# Patient Record
Sex: Male | Born: 1960
Health system: Southern US, Community
[De-identification: ages and names within clinical notes are randomized; demographics above are authoritative.]

## PROBLEM LIST (undated history)

## (undated) DIAGNOSIS — K30 Functional dyspepsia: Secondary | ICD-10-CM

## (undated) DIAGNOSIS — Z9889 Other specified postprocedural states: Secondary | ICD-10-CM

## (undated) DIAGNOSIS — Z87442 Personal history of urinary calculi: Secondary | ICD-10-CM

## (undated) DIAGNOSIS — R269 Unspecified abnormalities of gait and mobility: Secondary | ICD-10-CM

## (undated) DIAGNOSIS — M1990 Unspecified osteoarthritis, unspecified site: Secondary | ICD-10-CM

## (undated) DIAGNOSIS — K5792 Diverticulitis of intestine, part unspecified, without perforation or abscess without bleeding: Secondary | ICD-10-CM

## (undated) DIAGNOSIS — J189 Pneumonia, unspecified organism: Secondary | ICD-10-CM

## (undated) DIAGNOSIS — E119 Type 2 diabetes mellitus without complications: Secondary | ICD-10-CM

## (undated) DIAGNOSIS — R112 Nausea with vomiting, unspecified: Secondary | ICD-10-CM

## (undated) DIAGNOSIS — Z973 Presence of spectacles and contact lenses: Secondary | ICD-10-CM

## (undated) HISTORY — DX: Diverticulitis of intestine, part unspecified, without perforation or abscess without bleeding: K57.92

## (undated) HISTORY — PX: MEDIAL COLLATERAL LIGAMENT REPAIR, KNEE: SHX2019

## (undated) HISTORY — PX: HERNIA REPAIR: SHX51

## (undated) HISTORY — PX: BILATERAL KNEE ARTHROSCOPY: SUR91

## (undated) HISTORY — PX: OTHER SURGICAL HISTORY: SHX169

## (undated) HISTORY — PX: ROTATOR CUFF REPAIR: SHX139

## (undated) HISTORY — PX: CERVICAL SPINE SURGERY: SHX589

---

## 2009-05-17 ENCOUNTER — Emergency Department (HOSPITAL_COMMUNITY): Admission: EM | Admit: 2009-05-17 | Discharge: 2009-05-17 | Payer: Self-pay | Admitting: Emergency Medicine

## 2009-11-01 ENCOUNTER — Emergency Department (HOSPITAL_COMMUNITY): Admission: EM | Admit: 2009-11-01 | Discharge: 2009-11-01 | Payer: Self-pay | Admitting: Emergency Medicine

## 2011-02-08 LAB — POCT CARDIAC MARKERS
Myoglobin, poc: 55.9 ng/mL (ref 12–200)
Myoglobin, poc: 60.4 ng/mL (ref 12–200)
Troponin i, poc: <0.05 ng/mL (ref 0.00–0.09)

## 2011-02-08 LAB — BASIC METABOLIC PANEL
Chloride: 105 mEq/L (ref 96–112)
Creatinine, Ser: 0.83 mg/dL (ref 0.4–1.5)
GFR calc Af Amer: >60 mL/min (ref >60)
Potassium: 3.8 mEq/L (ref 3.5–5.1)

## 2011-02-08 LAB — CBC
HCT: 43.2 % (ref 39.0–52.0)
MCV: 93.9 fL (ref 78.0–100.0)
RBC: 4.6 MIL/uL (ref 4.22–5.81)
WBC: 7.4 10*3/uL (ref 4.0–10.5)

## 2011-02-08 LAB — DIFFERENTIAL
Eosinophils Absolute: 0.1 10*3/uL (ref 0.0–0.7)
Lymphs Abs: 1.4 10*3/uL (ref 0.7–4.0)
Monocytes Relative: 6 % (ref 3–12)
Neutrophils Relative %: 73 % (ref 43–77)

## 2015-06-20 ENCOUNTER — Ambulatory Visit: Payer: Self-pay | Admitting: General Surgery

## 2015-06-20 NOTE — H&P (Unsigned)
History of Present Illness Axel Filler MD; 06/20/2015 10:47 AM) Patient words: Evaluate inguinal hernia.  The patient is a 54 year old male who presents with an inguinal hernia. Patient is a 54 year old male who is referred by Dr. Cleda Mccreedy for evaluation for a left inguinal hernia. The patient states he's had a proximal four-month history of left inguinal hernia. Patient is active and works on a farm. He notices some pain to his left testicle was treated for appears to be epididymitis. Patient underwent ultrasound which revealed a hernia. Patient is had no signs or symptoms of incarceration or granulation.   Other Problems Maryan Puls, CMA; 06/20/2015 10:32 AM) Arthritis Gastroesophageal Reflux Disease Inguinal Hernia Umbilical Hernia Repair  Past Surgical History Maryan Puls, CMA; 06/20/2015 10:32 AM) Colon Polyp Removal - Colonoscopy Foot Surgery Bilateral. Knee Surgery Bilateral. Open Inguinal Hernia Surgery Bilateral. Oral Surgery Shoulder Surgery Right. Vasectomy  Diagnostic Studies History Maryan Puls, [place]; 06/20/2015 10:32 AM) Colonoscopy 1-5 years ago  Allergies Maryan Puls, CMA; 06/20/2015 10:33 AM) No Known Drug Allergies08/18/2016  Medication History Maryan Puls, [place]; 06/20/2015 10:34 AM) Medications Reconciled  Social History Maryan Puls, CMA; 06/20/2015 10:32 AM) Alcohol use Occasional alcohol use. Caffeine use Carbonated beverages. No drug use Tobacco use Never smoker.  Family History Maryan Puls, [place]; 06/20/2015 10:32 AM) Arthritis Father, Mother. Breast Cancer Mother. Colon Cancer Mother. Heart Disease Father, Mother. Heart disease in male family member before age 88 Heart disease in male family member before age 28 Malignant Neoplasm Of Pancreas Mother. Melanoma Father, Mother. Respiratory Condition Father, Mother.  Review of Systems Maryan Puls CMA; 06/20/2015 10:32 AM) General Not Present-  Appetite Loss, Chills, Fatigue, Fever, Night Sweats, Weight Gain and Weight Loss. Skin Not Present- Change in Wart/Mole, Dryness, Hives, Jaundice, New Lesions, Non-Healing Wounds, Rash and Ulcer. HEENT Not Present- Earache, Hearing Loss, Hoarseness, Nose Bleed, Oral Ulcers, Ringing in the Ears, Seasonal Allergies, Sinus Pain, Sore Throat, Visual Disturbances, Wears glasses/contact lenses and Yellow Eyes. Respiratory Not Present- Bloody sputum, Chronic Cough, Difficulty Breathing, Snoring and Wheezing. Cardiovascular Not Present- Chest Pain, Difficulty Breathing Lying Down, Leg Cramps, Palpitations, Rapid Heart Rate, Shortness of Breath and Swelling of Extremities. Gastrointestinal Present- Abdominal Pain and Bloody Stool. Not Present- Bloating, Change in Bowel Habits, Chronic diarrhea, Constipation, Difficulty Swallowing, Excessive gas, Gets full quickly at meals, Hemorrhoids, Indigestion, Nausea, Rectal Pain and Vomiting. Male Genitourinary Not Present- Blood in Urine, Change in Urinary Stream, Frequency, Impotence, Nocturia, Painful Urination, Urgency and Urine Leakage. Musculoskeletal Present- Joint Pain and Joint Stiffness. Not Present- Back Pain, Muscle Pain, Muscle Weakness and Swelling of Extremities. Neurological Not Present- Decreased Memory, Fainting, Headaches, Numbness, Seizures, Tingling, Tremor, Trouble walking and Weakness. Psychiatric Not Present- Anxiety, Bipolar, Change in Sleep Pattern, Depression, Fearful and Frequent crying. Endocrine Not Present- Cold Intolerance, Excessive Hunger, Hair Changes, Heat Intolerance, Hot flashes and New Diabetes. Hematology Not Present- Easy Bruising, Excessive bleeding, Gland problems, HIV and Persistent Infections.   Vitals Maryan Puls CMA; 06/20/2015 10:32 AM) 06/20/2015 10:32 AM Weight: 301.2 lb Height: 73in Body Surface Area: 2.65 m Body Mass Index: 39.74 kg/m Temp.: 97.63F(Temporal)  Pulse: 72 (Regular)  Resp.: 18 (Unlabored)   BP: 140/86 (Sitting, Left Arm, Standard)    Physical Exam Axel Filler MD; 06/20/2015 10:48 AM) General Mental Status-Alert. General Appearance-Consistent with stated age. Hydration-Well hydrated. Voice-Normal.  Head and Neck Head-normocephalic, atraumatic with no lesions or palpable masses. Trachea-midline. Thyroid Gland Characteristics - normal size and consistency.  Chest and Lung Exam Chest and lung exam  reveals -quiet, even and easy respiratory effort with no use of accessory muscles and on auscultation, normal breath sounds, no adventitious sounds and normal vocal resonance. Inspection Chest Wall - Normal. Back - normal.  Cardiovascular Cardiovascular examination reveals -normal heart sounds, regular rate and rhythm with no murmurs and normal pedal pulses bilaterally.  Abdomen Inspection Skin - Scar - no surgical scars. Hernias - Inguinal hernia - Left - Reducible(small). Palpation/Percussion Normal exam - Soft, Non Tender, No Rebound tenderness, No Rigidity (guarding) and No hepatosplenomegaly. Auscultation Normal exam - Bowel sounds normal.    Assessment & Plan Axel Filler MD; 06/20/2015 10:49 AM) LEFT INGUINAL HERNIA (550.90  K40.90) Impression: 54 year old male with a small left inguinal hernia.  1. The patient will like to proceed to the operating room for laparoscopic left inguinal hernia repair.  2. I discussed with the patient the signs and symptoms of incarceration and strangulation and the need to proceed to the ER should they occur.  3. I discussed with the patient the risks and benefits of the procedure to include but not limited to: Infection, bleeding, damage to surrounding structures, possible need for further surgery, possible nerve pain, and possible recurrence. The patient was understanding and wishes to proceed.

## 2015-10-23 ENCOUNTER — Encounter (HOSPITAL_COMMUNITY): Payer: Self-pay

## 2015-10-23 ENCOUNTER — Encounter (HOSPITAL_COMMUNITY)
Admission: RE | Admit: 2015-10-23 | Discharge: 2015-10-23 | Disposition: A | Discharge status: Home / Self Care | Payer: BLUE CROSS/BLUE SHIELD | Source: Ambulatory Visit | Attending: Orthopedic Surgery | Admitting: Orthopedic Surgery

## 2015-10-23 DIAGNOSIS — Z0183 Encounter for blood typing: Secondary | ICD-10-CM | POA: Insufficient documentation

## 2015-10-23 DIAGNOSIS — M170 Bilateral primary osteoarthritis of knee: Secondary | ICD-10-CM | POA: Diagnosis not present

## 2015-10-23 DIAGNOSIS — Z01812 Encounter for preprocedural laboratory examination: Secondary | ICD-10-CM | POA: Insufficient documentation

## 2015-10-23 HISTORY — DX: Other specified postprocedural states: R11.2

## 2015-10-23 HISTORY — DX: Unspecified osteoarthritis, unspecified site: M19.90

## 2015-10-23 HISTORY — DX: Pneumonia, unspecified organism: J18.9

## 2015-10-23 HISTORY — DX: Unspecified abnormalities of gait and mobility: R26.9

## 2015-10-23 HISTORY — DX: Other specified postprocedural states: Z98.890

## 2015-10-23 HISTORY — DX: Personal history of urinary calculi: Z87.442

## 2015-10-23 HISTORY — DX: Functional dyspepsia: K30

## 2015-10-23 HISTORY — DX: Presence of spectacles and contact lenses: Z97.3

## 2015-10-23 LAB — CBC
HEMATOCRIT: 47.3 % (ref 39.0–52.0)
Hemoglobin: 16.8 g/dL (ref 13.0–17.0)
MCH: 33.2 pg (ref 26.0–34.0)
MCHC: 35.5 g/dL (ref 30.0–36.0)
MCV: 93.5 fL (ref 78.0–100.0)
Platelets: 180 10*3/uL (ref 150–400)
RBC: 5.06 MIL/uL (ref 4.22–5.81)
RDW: 14.4 % (ref 11.5–15.5)
WBC: 8.6 10*3/uL (ref 4.0–10.5)

## 2015-10-23 LAB — BASIC METABOLIC PANEL
Anion gap: 9 (ref 5–15)
BUN: 18 mg/dL (ref 6–20)
CHLORIDE: 105 mmol/L (ref 101–111)
CO2: 30 mmol/L (ref 22–32)
Calcium: 9.8 mg/dL (ref 8.9–10.3)
Creatinine, Ser: 0.97 mg/dL (ref 0.61–1.24)
GFR calc Af Amer: >60 mL/min (ref >60)
GFR calc non Af Amer: >60 mL/min (ref >60)
Glucose, Bld: 135 mg/dL — ABNORMAL HIGH (ref 65–99)
POTASSIUM: 4 mmol/L (ref 3.5–5.1)
SODIUM: 144 mmol/L (ref 135–145)

## 2015-10-23 LAB — APTT: aPTT: 27 seconds (ref 24–37)

## 2015-10-23 LAB — SURGICAL PCR SCREEN
MRSA, PCR: NEGATIVE
Staphylococcus aureus: NEGATIVE

## 2015-10-23 LAB — URINALYSIS, ROUTINE W REFLEX MICROSCOPIC
BILIRUBIN URINE: NEGATIVE
Glucose, UA: NEGATIVE mg/dL
HGB URINE DIPSTICK: NEGATIVE
Ketones, ur: NEGATIVE mg/dL
Leukocytes, UA: NEGATIVE
NITRITE: NEGATIVE
PROTEIN: NEGATIVE mg/dL
Specific Gravity, Urine: 1.02 (ref 1.005–1.030)
pH: 6 (ref 5.0–8.0)

## 2015-10-23 LAB — PROTIME-INR
INR: 1.06 (ref 0.00–1.49)
Prothrombin Time: 14 seconds (ref 11.6–15.2)

## 2015-10-23 LAB — ABO/RH: ABO/RH(D): O POS

## 2015-10-23 NOTE — Progress Notes (Signed)
Your patient has screened at an elevated risk for Obstructive Sleep Apnea using the Stop-Bang Tool during a pre-surgical visit. Pt scored at high risk.  

## 2015-10-23 NOTE — Patient Instructions (Signed)
Russell [name]  10/23/2015   Your procedure is scheduled on: Thursday October 31, 2015   Report to [facility]  Entrance take [place]  elevators to 3rd floor to  Short Stay Center at 5:30 AM.  Call this number if you have problems the morning of surgery [phone]   Remember: ONLY 1 PERSON MAY GO WITH YOU TO SHORT STAY TO GET  READY MORNING OF YOUR SURGERY.  Do not eat food or drink liquids :After Midnight.     Take these medicines the morning of surgery with A SIP OF WATER: NONE                                You may not have any metal on your body including hair pins and              piercings  Do not wear jewelry,  lotions, powders or colognes, deodorant                           Men may shave face and neck.   Do not bring valuables to the hospital. [facility] IS NOT             RESPONSIBLE   FOR VALUABLES.  Contacts, dentures or bridgework may not be worn into surgery.  Leave suitcase in the car. After surgery it may be brought to your room.              Please read over the following fact sheets you were given:MRSA INFORMATION SHEET; INCENTIVE SPIROMETER; BLOOD TRANSFUSION INFORMATION SHEET _____________________________________________________________________             [facility] - Preparing for Surgery Before surgery, you can play an important role.  Because skin is not sterile, your skin needs to be as free of germs as possible.  You can reduce the number of germs on your skin by washing with CHG (chlorahexidine gluconate) soap before surgery.  CHG is an antiseptic cleaner which kills germs and bonds with the skin to continue killing germs even after washing. Please DO NOT use if you have an allergy to CHG or antibacterial soaps.  If your skin becomes reddened/irritated stop using the CHG and inform your nurse when you arrive at Short Stay. Do not shave (including legs and underarms) for at least 48 hours prior to the first CHG shower.  You  may shave your face/neck. Please follow these instructions carefully:  1.  Shower with CHG Soap the night before surgery and the  morning of Surgery.  2.  If you choose to wash your hair, wash your hair first as usual with your  normal  shampoo.  3.  After you shampoo, rinse your hair and body thoroughly to remove the  shampoo.                           4.  Use CHG as you would any other liquid soap.  You can apply chg directly  to the skin and wash                       Gently with a scrungie or clean washcloth.  5.  Apply the CHG Soap to your body ONLY FROM THE  NECK DOWN.   Do not use on face/ open                           Wound or open sores. Avoid contact with eyes, ears mouth and genitals (private parts).                       Wash face,  Genitals (private parts) with your normal soap.             6.  Wash thoroughly, paying special attention to the area where your surgery  will be performed.  7.  Thoroughly rinse your body with warm water from the neck down.  8.  DO NOT shower/wash with your normal soap after using and rinsing off  the CHG Soap.                9.  Pat yourself dry with a clean towel.            10.  Wear clean pajamas.            11.  Place clean sheets on your bed the night of your first shower and do not  sleep with pets. Day of Surgery : Do not apply any lotions/deodorants the morning of surgery.  Please wear clean clothes to the hospital/surgery center.  FAILURE TO FOLLOW THESE INSTRUCTIONS MAY RESULT IN THE CANCELLATION OF YOUR SURGERY PATIENT SIGNATURE_________________________________  NURSE SIGNATURE__________________________________  ________________________________________________________________________   Russell MireIncentive Spirometer  An incentive spirometer is a tool that can help keep your lungs clear and active. This tool measures how well you are filling your lungs with each breath. Taking long deep breaths may help reverse or decrease the chance of  developing breathing (pulmonary) problems (especially infection) following:  A long period of time when you are unable to move or be active. BEFORE THE PROCEDURE   If the spirometer includes an indicator to show your best effort, your nurse or respiratory therapist will set it to a desired goal.  If possible, sit up straight or lean slightly forward. Try not to slouch.  Hold the incentive spirometer in an upright position. INSTRUCTIONS FOR USE   Sit on the edge of your bed if possible, or sit up as far as you can in bed or on a chair.  Hold the incentive spirometer in an upright position.  Breathe out normally.  Place the mouthpiece in your mouth and seal your lips tightly around it.  Breathe in slowly and as deeply as possible, raising the piston or the ball toward the top of the column.  Hold your breath for 3-5 seconds or for as long as possible. Allow the piston or ball to fall to the bottom of the column.  Remove the mouthpiece from your mouth and breathe out normally.  Rest for a few seconds and repeat Steps 1 through 7 at least 10 times every 1-2 hours when you are awake. Take your time and take a few normal breaths between deep breaths.  The spirometer may include an indicator to show your best effort. Use the indicator as a goal to work toward during each repetition.  After each set of 10 deep breaths, practice coughing to be sure your lungs are clear. If you have an incision (the cut made at the time of surgery), support your incision when coughing by placing a pillow or rolled up towels firmly against it. Once you are able  to get out of bed, walk around indoors and cough well. You may stop using the incentive spirometer when instructed by your caregiver.  RISKS AND COMPLICATIONS  Take your time so you do not get dizzy or light-headed.  If you are in pain, you may need to take or ask for pain medication before doing incentive spirometry. It is harder to take a deep  breath if you are having pain. AFTER USE  Rest and breathe slowly and easily.  It can be helpful to keep track of a log of your progress. Your caregiver can provide you with a simple table to help with this. If you are using the spirometer at home, follow these instructions: [place] IF:   You are having difficultly using the spirometer.  You have trouble using the spirometer as often as instructed.  Your pain medication is not giving enough relief while using the spirometer.  You develop fever of 100.5 F (38.1 C) or higher. SEEK IMMEDIATE MEDICAL CARE IF:   You cough up bloody sputum that had not been present before.  You develop fever of 102 F (38.9 C) or greater.  You develop worsening pain at or near the incision site. MAKE SURE YOU:   Understand these instructions.  Will watch your condition.  Will get help right away if you are not doing well or get worse. Document Released: 03/01/2007 Document Revised: 01/11/2012 Document Reviewed: 05/02/2007 Exit[employer] Patient Information 2014 Exit[employer], [place].   ________________________________________________________________________  WHAT IS A BLOOD TRANSFUSION? Blood Transfusion Information  A transfusion is the replacement of blood or some of its parts. Blood is made up of multiple cells which provide different functions.  Red blood cells carry oxygen and are used for blood loss replacement.  White blood cells fight against infection.  Platelets control bleeding.  Plasma helps clot blood.  Other blood products are available for specialized needs, such as hemophilia or other clotting disorders. BEFORE THE TRANSFUSION  Who gives blood for transfusions?   Healthy volunteers who are fully evaluated to make sure their blood is safe. This is blood bank blood. Transfusion therapy is the safest it has ever been in the practice of medicine. Before blood is taken from a donor, a complete history is taken to make sure  that person has no history of diseases nor engages in risky social behavior (examples are intravenous drug use or sexual activity with multiple partners). The donor's travel history is screened to minimize risk of transmitting infections, such as malaria. The donated blood is tested for signs of infectious diseases, such as HIV and hepatitis. The blood is then tested to be sure it is compatible with you in order to minimize the chance of a transfusion reaction. If you or a relative donates blood, this is often done in anticipation of surgery and is not appropriate for emergency situations. It takes many days to process the donated blood. RISKS AND COMPLICATIONS Although transfusion therapy is very safe and saves many lives, the main dangers of transfusion include:   Getting an infectious disease.  Developing a transfusion reaction. This is an allergic reaction to something in the blood you were given. Every precaution is taken to prevent this. The decision to have a blood transfusion has been considered carefully by your caregiver before blood is given. Blood is not given unless the benefits outweigh the risks. AFTER THE TRANSFUSION  Right after receiving a blood transfusion, you will usually feel much better and more energetic. This is especially true  if your red blood cells have gotten low (anemic). The transfusion raises the level of the red blood cells which carry oxygen, and this usually causes an energy increase.  The nurse administering the transfusion will monitor you carefully for complications. HOME CARE INSTRUCTIONS  No special instructions are needed after a transfusion. You may find your energy is better. Speak with your caregiver about any limitations on activity for underlying diseases you may have. SEEK MEDICAL CARE IF:   Your condition is not improving after your transfusion.  You develop redness or irritation at the intravenous (IV) site. SEEK IMMEDIATE MEDICAL CARE IF:  Any of  the following symptoms occur over the next 12 hours:  Shaking chills.  You have a temperature by mouth above 102 F (38.9 C), not controlled by medicine.  Chest, back, or muscle pain.  People around you feel you are not acting correctly or are confused.  Shortness of breath or difficulty breathing.  Dizziness and fainting.  You get a rash or develop hives.  You have a decrease in urine output.  Your urine turns a dark color or changes to pink, red, or brown. Any of the following symptoms occur over the next 10 days:  You have a temperature by mouth above 102 F (38.9 C), not controlled by medicine.  Shortness of breath.  Weakness after normal activity.  The white part of the eye turns yellow (jaundice).  You have a decrease in the amount of urine or are urinating less often.  Your urine turns a dark color or changes to pink, red, or brown. Document Released: 10/16/2000 Document Revised: 01/11/2012 Document Reviewed: 06/04/2008 [facility] Patient Information 2014 [place], [place].  _______________________________________________________________________

## 2015-10-28 NOTE — H&P (Signed)
UNICOMPARTMENTAL KNEE ADMISSION H&P  Patient is being admitted for bilateral medial unicompartmental knee arthroplasty.  Subjective:  Chief Complaint:  Bilateral knee medial compartmental primary OA /pain    HPI: Russell [name], 54 y.o. male male, has a history of pain and functional disability in the bilateral and has failed non-surgical conservative treatments for greater than 12 weeks to include NSAID's and/or analgesics, corticosteriod injections, viscosupplementation injections and activity modification.  Onset of symptoms was gradual, starting >10 years ago with gradually worsening course since that time. The patient noted prior procedures on the knee to include  ACL reconstruction on the left knee(s).  Patient currently rates pain in the bilateral knee(s) at 9 out of 10 with activity. Patient has worsening of pain with activity and weight bearing, pain that interferes with activities of daily living, pain with passive range of motion, crepitus and joint swelling.  Patient has evidence of periarticular osteophytes and joint space narrowing of the medial compartment by imaging studies.  There is no active infection.  Risks, benefits and expectations were discussed with the patient.  Risks including but not limited to the risk of anesthesia, blood clots, nerve damage, blood vessel damage, failure of the prosthesis, infection and up to and including death.  Patient understand the risks, benefits and expectations and wishes to proceed with surgery.   PCP: Donovan KailOSS,ALLeN, MD  D/C Plans:      Home with HHPT  Post-op Meds:       No Rx given   Tranexamic Acid:      To be given - IV    Decadron:    It is to be given  FYI:     Xarelto then ASA  Oxycodone  TUMS order needed     Past Medical History  Diagnosis Date  . PONV (postoperative nausea and vomiting)   . Wears glasses   . Abnormality of gait and mobility   . History of kidney stones   . Indigestion   . Arthritis   . Pneumonia      history of times one - 2010     Past Surgical History  Procedure Laterality Date  . Bilat achilles replacement     . Rotator cuff repair      right shoulder   . Hernia repair      inguinal - left   . Bilateral knee arthroscopy    . Medial collateral ligament repair, knee      bilat   . Acl bilat knee      repair   . Cervical spine surgery      C6 secondary to trauma     No Known Allergies  Social History  Substance Use Topics  . Smoking status: Never Smoker   . Smokeless tobacco: Never Used  . Alcohol Use: Yes     Comment: rarely     No family history on file.   Review of Systems  Constitutional: Negative.   HENT: Negative.   Eyes: Negative.   Respiratory: Negative.   Cardiovascular: Negative.   Gastrointestinal: Positive for heartburn.  Genitourinary: Negative.   Musculoskeletal: Positive for joint pain.  Skin: Negative.   Neurological: Negative.   Endo/Heme/Allergies: Negative.   Psychiatric/Behavioral: Negative.      Objective:   Physical Exam  Constitutional: He is well-developed, well-nourished, and in no distress.  HENT:  Head: Normocephalic.  Eyes: Pupils are equal, round, and reactive to light.  Neck: Neck supple. No JVD present. No tracheal deviation present. No thyromegaly present.  Cardiovascular: Normal rate, regular rhythm, normal heart sounds and intact distal pulses.   Pulmonary/Chest: Effort normal and breath sounds normal. No stridor. No respiratory distress. He has no wheezes.  Abdominal: Soft. There is no tenderness. There is no guarding.  Musculoskeletal:       Right knee: He exhibits decreased range of motion, swelling and bony tenderness. He exhibits no ecchymosis, no deformity, no laceration and no erythema. Tenderness found. Medial joint line tenderness noted. No lateral joint line tenderness noted.       Left knee: He exhibits decreased range of motion, swelling and bony tenderness. He exhibits no ecchymosis, no deformity,  no laceration and no erythema. Tenderness found. Medial joint line tenderness noted. No lateral joint line tenderness noted.  Lymphadenopathy:    He has no cervical adenopathy.  Neurological: He is alert. A sensory deficit (Numbness in bilateral feet) is present.  Skin: Skin is warm and dry.  Psychiatric: Affect normal.         Imaging Review Plain radiographs demonstrate severe degenerative joint disease of the bilateral knee(s) medial compartment.  The bone quality appears to be good for age and reported activity level.  Assessment/Plan:  End stage arthritis, bilateral knee medial compartment  The patient history, physical examination, clinical judgment of the provider and imaging studies are consistent with end stage degenerative joint disease of the bilateral knee(s) and medial unicompartmental knee arthroplasty is deemed medically necessary. The treatment options including medical management, injection therapy arthroscopy and arthroplasty were discussed at length. The risks and benefits of total knee arthroplasty were presented and reviewed. The risks due to aseptic loosening, infection, stiffness, patella tracking problems, thromboembolic complications and other imponderables were discussed. The patient acknowledged the explanation, agreed to proceed with the plan and consent was signed. Patient is being admitted for outpatient / observation treatment for surgery, pain control, PT, OT, prophylactic antibiotics, VTE prophylaxis, progressive ambulation and ADL's and discharge planning. The patient is planning to be discharged home with home health services.    Anastasio Auerbach Tarren Sabree   PA-C  10/28/2015, 10:08 PM

## 2015-10-30 MED ORDER — DEXTROSE 5 % IV SOLN
3.0000 g | INTRAVENOUS | Status: AC
  Administered 2015-10-31: 3 g via INTRAVENOUS
  Filled 2015-10-30 (×2): qty 3000

## 2015-10-30 NOTE — Anesthesia Preprocedure Evaluation (Addendum)
Anesthesia Evaluation  Patient identified by MRN, date of birth, ID band Patient awake    Reviewed: Allergy & Precautions, NPO status , Patient's Chart, lab work & pertinent test results  History of Anesthesia Complications (+) PONV and history of anesthetic complications  Airway Mallampati: II  TM Distance: >3 FB Neck ROM: Full    Dental  (+) Teeth Intact, Dental Advisory Given   Pulmonary neg pulmonary ROS,    Pulmonary exam normal        Cardiovascular negative cardio ROS Normal cardiovascular exam     Neuro/Psych negative neurological ROS     GI/Hepatic negative GI ROS,   Endo/Other  Morbid obesity  Renal/GU      Musculoskeletal   Abdominal   Peds  Hematology   Anesthesia Other Findings   Reproductive/Obstetrics                            Anesthesia Physical Anesthesia Plan  ASA: III  Anesthesia Plan: MAC and Spinal   Post-op Pain Management:    Induction:   Airway Management Planned: Simple Face Mask  Additional Equipment:   Intra-op Plan:   Post-operative Plan:   Informed Consent: I have reviewed the patients History and Physical, chart, labs and discussed the procedure including the risks, benefits and alternatives for the proposed anesthesia with the patient or authorized representative who has indicated his/her understanding and acceptance.   Dental advisory given  Plan Discussed with: CRNA and Anesthesiologist  Anesthesia Plan Comments:        Anesthesia Quick Evaluation

## 2015-10-31 ENCOUNTER — Encounter (HOSPITAL_COMMUNITY): Payer: Self-pay | Admitting: *Deleted

## 2015-10-31 ENCOUNTER — Observation Stay (HOSPITAL_COMMUNITY)
Admission: RE | Admit: 2015-10-31 | Discharge: 2015-11-01 | Disposition: A | Discharge status: Home / Self Care | Payer: BLUE CROSS/BLUE SHIELD | Source: Ambulatory Visit | Attending: Orthopedic Surgery | Admitting: Orthopedic Surgery

## 2015-10-31 ENCOUNTER — Inpatient Hospital Stay (HOSPITAL_COMMUNITY): Payer: BLUE CROSS/BLUE SHIELD | Admitting: Anesthesiology

## 2015-10-31 ENCOUNTER — Encounter (HOSPITAL_COMMUNITY)
Admission: RE | Disposition: A | Discharge status: Home / Self Care | Payer: Self-pay | Source: Ambulatory Visit | Attending: Orthopedic Surgery

## 2015-10-31 DIAGNOSIS — M170 Bilateral primary osteoarthritis of knee: Secondary | ICD-10-CM | POA: Diagnosis not present

## 2015-10-31 DIAGNOSIS — Z96653 Presence of artificial knee joint, bilateral: Secondary | ICD-10-CM

## 2015-10-31 DIAGNOSIS — Z87442 Personal history of urinary calculi: Secondary | ICD-10-CM | POA: Diagnosis not present

## 2015-10-31 DIAGNOSIS — Z6841 Body Mass Index (BMI) 40.0 and over, adult: Secondary | ICD-10-CM | POA: Insufficient documentation

## 2015-10-31 DIAGNOSIS — R269 Unspecified abnormalities of gait and mobility: Secondary | ICD-10-CM | POA: Diagnosis not present

## 2015-10-31 HISTORY — PX: PARTIAL KNEE ARTHROPLASTY: SHX2174

## 2015-10-31 HISTORY — DX: Presence of artificial knee joint, bilateral: Z96.653

## 2015-10-31 LAB — TYPE AND SCREEN
ABO/RH(D): O POS
ANTIBODY SCREEN: NEGATIVE

## 2015-10-31 SURGERY — ARTHROPLASTY, KNEE, BILATERAL, UNICOMPARTMENTAL
Anesthesia: Monitor Anesthesia Care | Site: Knee | Laterality: Bilateral

## 2015-10-31 MED ORDER — HYDROMORPHONE HCL 1 MG/ML IJ SOLN: 0.2500 mg | INTRAMUSCULAR | Status: DC | PRN

## 2015-10-31 MED ORDER — MENTHOL 3 MG MT LOZG: 1.0000 | LOZENGE | OROMUCOSAL | Status: DC | PRN

## 2015-10-31 MED ORDER — DIPHENHYDRAMINE HCL 25 MG PO CAPS: 25.0000 mg | ORAL_CAPSULE | Freq: Four times a day (QID) | ORAL | Status: DC | PRN

## 2015-10-31 MED ORDER — SODIUM CHLORIDE 0.9 % IV SOLN
INTRAVENOUS | Status: DC
  Administered 2015-10-31: 13:00:00 via INTRAVENOUS
  Filled 2015-10-31 (×4): qty 1000

## 2015-10-31 MED ORDER — CELECOXIB 200 MG PO CAPS
200.0000 mg | ORAL_CAPSULE | Freq: Two times a day (BID) | ORAL | Status: DC
  Administered 2015-10-31 – 2015-11-01 (×2): 200 mg via ORAL
  Filled 2015-10-31 (×4): qty 1

## 2015-10-31 MED ORDER — MIDAZOLAM HCL 2 MG/2ML IJ SOLN
INTRAMUSCULAR | Status: AC
  Filled 2015-10-31: qty 2

## 2015-10-31 MED ORDER — PROMETHAZINE HCL 25 MG/ML IJ SOLN: 6.2500 mg | INTRAMUSCULAR | Status: DC | PRN

## 2015-10-31 MED ORDER — SODIUM CHLORIDE 0.9 % IJ SOLN
INTRAMUSCULAR | Status: AC
  Filled 2015-10-31: qty 50

## 2015-10-31 MED ORDER — DEXAMETHASONE SODIUM PHOSPHATE 10 MG/ML IJ SOLN
10.0000 mg | Freq: Once | INTRAMUSCULAR | Status: AC
  Administered 2015-11-01: 10 mg via INTRAVENOUS
  Filled 2015-10-31: qty 1

## 2015-10-31 MED ORDER — RIVAROXABAN 10 MG PO TABS
10.0000 mg | ORAL_TABLET | Freq: Every day | ORAL | Status: DC
  Administered 2015-11-01: 10 mg via ORAL
  Filled 2015-10-31 (×2): qty 1

## 2015-10-31 MED ORDER — PROPOFOL 10 MG/ML IV BOLUS
INTRAVENOUS | Status: AC
  Filled 2015-10-31: qty 20

## 2015-10-31 MED ORDER — LACTATED RINGERS IV SOLN
INTRAVENOUS | Status: DC | PRN
  Administered 2015-10-31 (×3): via INTRAVENOUS

## 2015-10-31 MED ORDER — PROPOFOL 10 MG/ML IV BOLUS
INTRAVENOUS | Status: DC | PRN
  Administered 2015-10-31 (×2): 20 mg via INTRAVENOUS
  Administered 2015-10-31: 10 mg via INTRAVENOUS
  Administered 2015-10-31 (×3): 20 mg via INTRAVENOUS

## 2015-10-31 MED ORDER — METOCLOPRAMIDE HCL 10 MG PO TABS: 5.0000 mg | ORAL_TABLET | Freq: Three times a day (TID) | ORAL | Status: DC | PRN

## 2015-10-31 MED ORDER — BUPIVACAINE-EPINEPHRINE 0.25% -1:200000 IJ SOLN
INTRAMUSCULAR | Status: DC | PRN
  Administered 2015-10-31 (×2): 15 mL

## 2015-10-31 MED ORDER — METOCLOPRAMIDE HCL 5 MG/ML IJ SOLN: 5.0000 mg | Freq: Three times a day (TID) | INTRAMUSCULAR | Status: DC | PRN

## 2015-10-31 MED ORDER — ONDANSETRON HCL 4 MG/2ML IJ SOLN
INTRAMUSCULAR | Status: DC | PRN
  Administered 2015-10-31: 4 mg via INTRAVENOUS

## 2015-10-31 MED ORDER — CHLORHEXIDINE GLUCONATE 4 % EX LIQD: 60.0000 mL | Freq: Once | CUTANEOUS | Status: DC

## 2015-10-31 MED ORDER — DEXAMETHASONE SODIUM PHOSPHATE 10 MG/ML IJ SOLN: 10.0000 mg | Freq: Once | INTRAMUSCULAR | Status: DC

## 2015-10-31 MED ORDER — OXYCODONE HCL 5 MG PO TABS
5.0000 mg | ORAL_TABLET | ORAL | Status: DC | PRN
  Administered 2015-10-31 – 2015-11-01 (×7): 10 mg via ORAL
  Filled 2015-10-31 (×7): qty 2

## 2015-10-31 MED ORDER — FENTANYL CITRATE (PF) 100 MCG/2ML IJ SOLN
INTRAMUSCULAR | Status: DC | PRN
  Administered 2015-10-31 (×4): 50 ug via INTRAVENOUS

## 2015-10-31 MED ORDER — CEFAZOLIN SODIUM-DEXTROSE 2-3 GM-% IV SOLR
2.0000 g | Freq: Four times a day (QID) | INTRAVENOUS | Status: AC
  Administered 2015-10-31 (×2): 2 g via INTRAVENOUS
  Filled 2015-10-31 (×2): qty 50

## 2015-10-31 MED ORDER — FENTANYL CITRATE (PF) 100 MCG/2ML IJ SOLN
INTRAMUSCULAR | Status: AC
  Filled 2015-10-31: qty 2

## 2015-10-31 MED ORDER — KETOROLAC TROMETHAMINE 30 MG/ML IJ SOLN
INTRAMUSCULAR | Status: AC
  Filled 2015-10-31: qty 1

## 2015-10-31 MED ORDER — ALUM & MAG HYDROXIDE-SIMETH 200-200-20 MG/5ML PO SUSP: 30.0000 mL | ORAL | Status: DC | PRN

## 2015-10-31 MED ORDER — MIDAZOLAM HCL 5 MG/5ML IJ SOLN
INTRAMUSCULAR | Status: DC | PRN
  Administered 2015-10-31: 2 mg via INTRAVENOUS

## 2015-10-31 MED ORDER — PROPOFOL 10 MG/ML IV BOLUS
INTRAVENOUS | Status: AC
Start: 2015-10-31 — End: 2015-10-31
  Filled 2015-10-31: qty 60

## 2015-10-31 MED ORDER — MAGNESIUM CITRATE PO SOLN: 1.0000 | Freq: Once | ORAL | Status: DC | PRN

## 2015-10-31 MED ORDER — ONDANSETRON HCL 4 MG PO TABS: 4.0000 mg | ORAL_TABLET | Freq: Four times a day (QID) | ORAL | Status: DC | PRN

## 2015-10-31 MED ORDER — ALPRAZOLAM 0.25 MG PO TABS
0.2500 mg | ORAL_TABLET | Freq: Three times a day (TID) | ORAL | Status: DC | PRN
  Administered 2015-10-31: 0.25 mg via ORAL
  Filled 2015-10-31: qty 1

## 2015-10-31 MED ORDER — SODIUM CHLORIDE 0.9 % IV SOLN
1000.0000 mg | Freq: Once | INTRAVENOUS | Status: AC
  Administered 2015-10-31: 1000 mg via INTRAVENOUS
  Filled 2015-10-31: qty 10

## 2015-10-31 MED ORDER — EPHEDRINE SULFATE 50 MG/ML IJ SOLN
INTRAMUSCULAR | Status: AC
  Filled 2015-10-31: qty 1

## 2015-10-31 MED ORDER — PROPOFOL 10 MG/ML IV BOLUS
INTRAVENOUS | Status: AC
  Filled 2015-10-31: qty 40

## 2015-10-31 MED ORDER — SCOPOLAMINE 1 MG/3DAYS TD PT72
MEDICATED_PATCH | TRANSDERMAL | Status: DC | PRN
  Administered 2015-10-31: 1 via TRANSDERMAL

## 2015-10-31 MED ORDER — METHOCARBAMOL 1000 MG/10ML IJ SOLN
500.0000 mg | Freq: Four times a day (QID) | INTRAVENOUS | Status: DC | PRN
  Administered 2015-10-31: 500 mg via INTRAVENOUS
  Filled 2015-10-31 (×2): qty 5

## 2015-10-31 MED ORDER — ONDANSETRON HCL 4 MG/2ML IJ SOLN: 4.0000 mg | Freq: Four times a day (QID) | INTRAMUSCULAR | Status: DC | PRN

## 2015-10-31 MED ORDER — 0.9 % SODIUM CHLORIDE (POUR BTL) OPTIME
TOPICAL | Status: DC | PRN
  Administered 2015-10-31 (×2): 1000 mL

## 2015-10-31 MED ORDER — BUPIVACAINE HCL (PF) 0.5 % IJ SOLN
INTRAMUSCULAR | Status: DC | PRN
  Administered 2015-10-31: 3 mL via INTRATHECAL

## 2015-10-31 MED ORDER — CALCIUM CARBONATE ANTACID 500 MG PO CHEW: 1.0000 | CHEWABLE_TABLET | Freq: Two times a day (BID) | ORAL | Status: DC | PRN

## 2015-10-31 MED ORDER — HYDROMORPHONE HCL 1 MG/ML IJ SOLN
0.5000 mg | INTRAMUSCULAR | Status: DC | PRN
  Administered 2015-10-31: 1 mg via INTRAVENOUS
  Administered 2015-10-31: 0.5 mg via INTRAVENOUS
  Filled 2015-10-31 (×2): qty 1

## 2015-10-31 MED ORDER — POLYETHYLENE GLYCOL 3350 17 G PO PACK: 17.0000 g | PACK | Freq: Two times a day (BID) | ORAL | Status: DC

## 2015-10-31 MED ORDER — SODIUM CHLORIDE 0.9 % IJ SOLN
INTRAMUSCULAR | Status: AC
  Filled 2015-10-31: qty 10

## 2015-10-31 MED ORDER — ONDANSETRON HCL 4 MG/2ML IJ SOLN
INTRAMUSCULAR | Status: AC
  Filled 2015-10-31: qty 2

## 2015-10-31 MED ORDER — PHENOL 1.4 % MT LIQD: 1.0000 | OROMUCOSAL | Status: DC | PRN

## 2015-10-31 MED ORDER — KETOROLAC TROMETHAMINE 30 MG/ML IJ SOLN
INTRAMUSCULAR | Status: DC | PRN
  Administered 2015-10-31 (×2): 15 mg

## 2015-10-31 MED ORDER — SODIUM CHLORIDE 0.9 % IJ SOLN
INTRAMUSCULAR | Status: DC | PRN
  Administered 2015-10-31 (×2): 15 mL

## 2015-10-31 MED ORDER — BISACODYL 10 MG RE SUPP: 10.0000 mg | Freq: Every day | RECTAL | Status: DC | PRN

## 2015-10-31 MED ORDER — PROPOFOL 500 MG/50ML IV EMUL
INTRAVENOUS | Status: DC | PRN
  Administered 2015-10-31: 50 ug/kg/min via INTRAVENOUS

## 2015-10-31 MED ORDER — FERROUS SULFATE 325 (65 FE) MG PO TABS
325.0000 mg | ORAL_TABLET | Freq: Three times a day (TID) | ORAL | Status: DC
  Administered 2015-11-01: 325 mg via ORAL
  Filled 2015-10-31 (×4): qty 1

## 2015-10-31 MED ORDER — BUPIVACAINE-EPINEPHRINE (PF) 0.25% -1:200000 IJ SOLN
INTRAMUSCULAR | Status: AC
  Filled 2015-10-31: qty 30

## 2015-10-31 MED ORDER — SCOPOLAMINE 1 MG/3DAYS TD PT72
MEDICATED_PATCH | TRANSDERMAL | Status: AC
  Filled 2015-10-31: qty 1

## 2015-10-31 MED ORDER — DOCUSATE SODIUM 100 MG PO CAPS
100.0000 mg | ORAL_CAPSULE | Freq: Two times a day (BID) | ORAL | Status: DC
  Administered 2015-10-31 – 2015-11-01 (×2): 100 mg via ORAL

## 2015-10-31 MED ORDER — METHOCARBAMOL 500 MG PO TABS
500.0000 mg | ORAL_TABLET | Freq: Four times a day (QID) | ORAL | Status: DC | PRN
  Administered 2015-10-31 – 2015-11-01 (×2): 500 mg via ORAL
  Filled 2015-10-31 (×2): qty 1

## 2015-10-31 MED ORDER — DEXAMETHASONE SODIUM PHOSPHATE 10 MG/ML IJ SOLN
INTRAMUSCULAR | Status: AC
  Filled 2015-10-31: qty 1

## 2015-10-31 SURGICAL SUPPLY — 59 items
BAG DECANTER FOR FLEXI CONT (MISCELLANEOUS) IMPLANT
BAG SPEC THK2 15X12 ZIP CLS (MISCELLANEOUS) ×1
BAG ZIPLOCK 12X15 (MISCELLANEOUS) ×2 IMPLANT
BANDAGE ACE 6X5 VEL STRL LF (GAUZE/BANDAGES/DRESSINGS) ×4 IMPLANT
BANDAGE ELASTIC 6 VELCRO ST LF (GAUZE/BANDAGES/DRESSINGS) ×4 IMPLANT
BANDAGE ESMARK 6X9 LF (GAUZE/BANDAGES/DRESSINGS) ×1 IMPLANT
BLADE SAW RECIPROCATING 77.5 (BLADE) ×4 IMPLANT
BLADE SAW SGTL 18X1.27X75 (BLADE) ×4 IMPLANT
BNDG COHESIVE 6X5 TAN STRL LF (GAUZE/BANDAGES/DRESSINGS) ×2 IMPLANT
BNDG ESMARK 6X9 LF (GAUZE/BANDAGES/DRESSINGS) ×2
BOWL SMART MIX CTS (DISPOSABLE) ×4 IMPLANT
CAPT KNEE PARTIAL 2 ×4 IMPLANT
CEMENT BONE DEPUY (Cement) ×4 IMPLANT
CLEANER TIP ELECTROSURG 2X2 (MISCELLANEOUS) ×2 IMPLANT
CUFF TOURN SGL QUICK 34 (TOURNIQUET CUFF) ×2
CUFF TRNQT CYL 34X4X40X1 (TOURNIQUET CUFF) ×2 IMPLANT
DRAPE EXTREMITY BILATERAL (DRAPE) ×2 IMPLANT
DRAPE INCISE IOBAN 66X45 STRL (DRAPES) ×2 IMPLANT
DRAPE POUCH INSTRU U-SHP 10X18 (DRAPES) ×2 IMPLANT
DRAPE SHEET LG 3/4 BI-LAMINATE (DRAPES) IMPLANT
DRAPE U-SHAPE 47X51 STRL (DRAPES) ×6 IMPLANT
DRSG AQUACEL AG ADV 3.5X10 (GAUZE/BANDAGES/DRESSINGS) ×6 IMPLANT
DURAPREP 26ML APPLICATOR (WOUND CARE) ×4 IMPLANT
ELECT CAUTERY BLADE 6.4 (BLADE) ×2 IMPLANT
ELECT PENCIL ROCKER SW 15FT (MISCELLANEOUS) ×2 IMPLANT
ELECT REM PT RETURN 9FT ADLT (ELECTROSURGICAL) ×2
ELECTRODE REM PT RTRN 9FT ADLT (ELECTROSURGICAL) ×1 IMPLANT
FACESHIELD WRAPAROUND (MASK) IMPLANT
GLOVE BIOGEL M 7.0 STRL (GLOVE) IMPLANT
GLOVE BIOGEL PI IND STRL 7.5 (GLOVE) ×4 IMPLANT
GLOVE BIOGEL PI IND STRL 8.5 (GLOVE) ×1 IMPLANT
GLOVE BIOGEL PI INDICATOR 7.5 (GLOVE) ×4
GLOVE BIOGEL PI INDICATOR 8.5 (GLOVE) ×1
GLOVE ECLIPSE 8.0 STRL XLNG CF (GLOVE) ×4 IMPLANT
GLOVE ORTHO TXT STRL SZ7.5 (GLOVE) ×6 IMPLANT
GLOVE SURG SS PI 7.5 STRL IVOR (GLOVE) ×6 IMPLANT
GOWN BRE IMP SLV SIRUS LXLNG (GOWN DISPOSABLE) ×2 IMPLANT
GOWN STRL REUS W/TWL 2XL LVL3 (GOWN DISPOSABLE) IMPLANT
GOWN STRL REUS W/TWL LRG LVL3 (GOWN DISPOSABLE) ×2 IMPLANT
GOWN STRL REUS W/TWL XL LVL3 (GOWN DISPOSABLE) ×4 IMPLANT
KIT BASIN OR (CUSTOM PROCEDURE TRAY) IMPLANT
LEGGING LITHOTOMY PAIR STRL (DRAPES) ×2 IMPLANT
LIQUID BAND (GAUZE/BANDAGES/DRESSINGS) ×4 IMPLANT
MANIFOLD NEPTUNE II (INSTRUMENTS) ×2 IMPLANT
MARKER SKIN DUAL TIP RULER LAB (MISCELLANEOUS) IMPLANT
PACK TOTAL JOINT (CUSTOM PROCEDURE TRAY) IMPLANT
PACK TOTAL KNEE CUSTOM (KITS) ×2 IMPLANT
POSITIONER SURGICAL ARM (MISCELLANEOUS) IMPLANT
SPONGE LAP 18X18 X RAY DECT (DISPOSABLE) ×2 IMPLANT
STOCKINETTE 8 INCH (MISCELLANEOUS) ×2 IMPLANT
SUCTION FRAZIER TIP 10 FR DISP (SUCTIONS) ×2 IMPLANT
SUT MNCRL AB 4-0 PS2 18 (SUTURE) ×4 IMPLANT
SUT VIC AB 1 CT1 36 (SUTURE) ×4 IMPLANT
SUT VIC AB 2-0 CT1 27 (SUTURE) ×5
SUT VIC AB 2-0 CT1 TAPERPNT 27 (SUTURE) ×5 IMPLANT
TOWEL OR 17X26 10 PK STRL BLUE (TOWEL DISPOSABLE) ×2 IMPLANT
TRAY FOLEY W/METER SILVER 14FR (SET/KITS/TRAYS/PACK) IMPLANT
TRAY FOLEY W/METER SILVER 16FR (SET/KITS/TRAYS/PACK) ×2 IMPLANT
YANKAUER SUCT BULB TIP 10FT TU (MISCELLANEOUS) IMPLANT

## 2015-10-31 NOTE — Evaluation (Signed)
Physical Therapy Evaluation Patient Details Name: Russell [name] MRN: [id number] DOB: [birth year] Today's Date: 10/31/2015   History of Present Illness  Pt is a 54 year old male s/p bilateral unicompartmental knee medially  Clinical Impression  Patient is s/p above surgery resulting in functional limitations due to the deficits listed below (see PT Problem List).  Patient will benefit from skilled PT to increase their independence and safety with mobility to allow discharge to the venue listed below.  Pt tolerated mobility well POD #0 and anticipates d/c home with spouse tomorrow.      Follow Up Recommendations  (per MD)    Equipment Recommendations  Rolling walker with 5" wheels    Recommendations for Other Services       Precautions / Restrictions Precautions Precautions: Fall;Knee Restrictions Other Position/Activity Restrictions: WBAT bilaterally      Mobility  Bed Mobility Overal bed mobility: Needs Assistance Bed Mobility: Supine to Sit     Supine to sit: Min assist;HOB elevated     General bed mobility comments: slight assist for trunk, increased time  Transfers Overall transfer level: Needs assistance Equipment used: Rolling walker (2 wheeled) Transfers: Sit to/from Stand Sit to Stand: Min guard         General transfer comment: verbal cues for safe technique, including UE and LE placement  Ambulation/Gait Ambulation/Gait assistance: Min guard Ambulation Distance (Feet): 35 Feet Assistive device: Rolling walker (2 wheeled) Gait Pattern/deviations: Step-through pattern;Decreased stride length;Shuffle;Wide base of support     General Gait Details: verbal cues for RW positioning, small short steps, no increase in pain  Stairs            Wheelchair Mobility    Modified Rankin (Stroke Patients Only)       Balance                                             Pertinent Vitals/Pain Pain Assessment: 0-10 Pain Score: 2   Pain Location: bil knees Pain Descriptors / Indicators: Sore Pain Intervention(s): Limited activity within patient's tolerance;Monitored during session;Premedicated before session;Repositioned    Home Living Family/patient expects to be discharged to:: Private residence Living Arrangements: Spouse/significant other     Home Access: Level entry     Home Layout: One level Home Equipment: None      Prior Function Level of Independence: Independent               Hand Dominance        Extremity/Trunk Assessment               Lower Extremity Assessment: RLE deficits/detail;LLE deficits/detail RLE Deficits / Details: observed at least 50* functionally bilaterally sitting EOB, able to perform bil SLR       Communication   Communication: No difficulties  Cognition Arousal/Alertness: Awake/alert Behavior During Therapy: WFL for tasks assessed/performed Overall Cognitive Status: Within Functional Limits for tasks assessed                      General Comments      Exercises        Assessment/Plan    PT Assessment Patient needs continued PT services  PT Diagnosis Difficulty walking;Acute pain   PT Problem List Decreased strength;Decreased range of motion;Decreased balance;Decreased mobility;Decreased knowledge of use of DME;Pain  PT Treatment Interventions Functional mobility training;Gait training;DME instruction;Patient/family education;Therapeutic activities;Therapeutic exercise  PT Goals (Current goals can be found in the Care Plan section) Acute Rehab PT Goals Patient Stated Goal: home as soon as possible PT Goal Formulation: With patient Time For Goal Achievement: 11/04/15 Potential to Achieve Goals: Good    Frequency 7X/week   Barriers to discharge        Co-evaluation               End of Session Equipment Utilized During Treatment: Gait belt Activity Tolerance: Patient tolerated treatment well Patient left: in chair;with  call bell/phone within reach;with family/visitor present Nurse Communication: Mobility status    Functional Assessment Tool Used: clinical judgement Functional Limitation: Mobility: Walking and moving around Mobility: Walking and Moving Around Current Status [phone](G8978): At least 20 percent but less than 40 percent impaired, limited or restricted Mobility: Walking and Moving Around Goal Status [phone](G8979): At least 1 percent but less than 20 percent impaired, limited or restricted    Time: [id number] PT Time Calculation (min) (ACUTE ONLY): 21 min   Charges:   PT Evaluation $Initial PT Evaluation Tier I: 1 Procedure     PT G Codes:   PT G-Codes **NOT FOR INPATIENT CLASS** Functional Assessment Tool Used: clinical judgement Functional Limitation: Mobility: Walking and moving around Mobility: Walking and Moving Around Current Status ([id number](G8978): At least 20 percent but less than 40 percent impaired, limited or restricted Mobility: Walking and Moving Around Goal Status [phone](G8979): At least 1 percent but less than 20 percent impaired, limited or restricted    Esa Raden,KATHrine E 10/31/2015, 4:19 PM Russell [name], PT, DPT 10/31/2015 Pager: [phone]

## 2015-10-31 NOTE — Op Note (Signed)
NAME: Russell [name]    MEDICAL RECORD NO.: [id number]   FACILITY: [facility]   DATE OF BIRTH: Aug 24, 1961  PHYSICIAN: Madlyn Frankel. Charlann Boxer, M.D.    DATE OF PROCEDURE: 10/31/2015    OPERATIVE REPORT   PREOPERATIVE DIAGNOSIS: Left knee medial compartment osteoarthritis.   POSTOPERATIVE DIAGNOSIS: Left knee medial compartment osteoarthritis.  PROCEDURE: Left partial knee replacement utilizing Biomet Oxford knee  component, size Medium femur, a left medial size C tibial tray with a size 5 mm insert.   SURGEON: Madlyn Frankel. Charlann Boxer, M.D.   ASSISTANT: Lanney Gins, PAC.  Please note that Mr. Russell [name] was present for the entirety of the case,  utilized for preoperative positioning, perioperative retractor  management, general facilitation of the case and primary wound closure.   ANESTHESIA: General.   SPECIMENS: None.   COMPLICATIONS: None.  DRAINS: None   TOURNIQUET TIME: 40 minutes at 250 mmHg.   INDICATIONS FOR PROCEDURE: The patient is a 54 y.o. patient of mine who presented for evaluation of bilateral knee pain.  They presented with primary complaints of pain on the medial side of their knee. Radiographs revealed advanced medial compartment arthritis with specifically an antero-medial wear pattern.  There was bone on bone changes noted with subchondral sclerosis and osteophytes present. The patient has had progressive problems failing to respond to conservative measures of medications, injections and activity modification. Risks of infection, DVT, component failure, need for future revision surgery were all discussed and reviewed.  Consent was obtained for benefit of pain relief.   PROCEDURE IN DETAIL: The patient was brought to the operative theater.  Once adequate anesthesia, preoperative antibiotics, 3 grams of Ancef, 1 gm of Tranexamic Acid, and 10 mg of Decadron administered, the patient was positioned in supine position with a left thigh tourniquet  placed. The left lower extremity was  prepped and draped in sterile  fashion with the leg on the Oxford leg holder.  The leg was allowed to flex to 120 degrees. A time-out  was performed identifying the patient, planned procedure, and extremity.  The leg was exsanguinated, tourniquet elevated to 250 mmHg. A midline  incision was made from the proximal pole of the patella to the tibial tubercle. A  soft tissue plane was created and partial median arthrotomy was then  made to allow for subluxation of the patella. Following initial synovectomy and  debridement, the osteophytes were removed off the medial aspect of the  knee.   Attention was first directed to the tibia. The tibial  extramedullary guide was positioned over the anterior crest of the tibia  and pinned into position, and using a measured resection guide from the  Oxford system, a 4 mm resection was made off the proximal tibia. First  the reciprocating saw along the medial aspect of the tibial spines, then the oscillating saw.    At this point, I sized this cut surface seem to be best fit for a size C tibial tray.  With the retractors out of the wound and the knee held at 90 degrees the 4 feeler gauge had appropriate tension on the medial ligament.   At this point, the femoral canal was opened with a drill and the  intramedullary rod passed. Then using the guide for a medium posterior resection off  the posterior aspect of the femur was positioned over the mid portion of the medial femoral condyle.  The orientation was set using the guide that mates the femoral guide to the intramedullary rod.  The 2 drill  holes were made into the distal femur.  The posterior guide was then impacted into place and the posterior  femoral cut made.  At this point, I milled the distal femur with a size 4 spigot in place. At this point, we did a trial reduction of the medium femur, size C tibial tray and a size 4 feeler gauge. At 90 degrees of flexion the ligaments felt stable but at 20  degrees of flexion the 3 feeler gauge was appropriate.  Given the difference in the tension between the knee in 90 degrees versus that in 20 degrees I had to place the 5 spigot into the femur and re-mill the distal femur.  Remaining bone was removed and debrided.  I repeated the trial reduction and found that now at both 90 degrees and 20 degrees the knee ligament were tension symmetrically with a 5 feeler gauge.  Given these findings, the trial femoral component was removed. Final preparation of tibia was carried out by pinning it in position. Then  using a reciprocating saw I removed bone for the keel. Further bone was  removed with an osteotome.  Trial reduction was now carried out with the medium femur, the left medial size C tibia, and the size 5 lollipop insert. The balance of the  ligaments appeared to be symmetric at 20 degrees and 90 degrees. Given  all these findings, the trial components were removed.   Cement was mixed. The final components were opened. The knee was irrigated with  normal saline solution. Then final debridements of the  soft tissue was carried out, I also drilled the sclerotic bone with a drill.  The final components were cemented with a single batch of cement in a  two-stage technique with the tibial component cemented first. The knee  was then brought  to 45 degrees of flexion with a size 5 feeler gauge, held with pressure for a minute and half.  After this the femoral component was cemented in place.  The knee was again held at 45 degrees of flexion while the cement fully cured.  Excess cement was removed throughout the knee. Tourniquet was let down  after 40 minutes. After the cement had fully cured and excessive cement  was removed throughout the knee there was no visualized cement present.   The final left medial size 5 mm insert to match the medium femur was chosen and snapped into position. We re-irrigated  the knee. The extensor mechanism  was then  reapproximated using a #1 Vicryl and #0 Quill suture with the knee in flexion. The  remaining wound was closed with 2-0 Vicryl and a running 4-0 Monocryl.  The knee was cleaned, dried, and dressed sterilely using Dermabond and  Aquacel dressing.   Upon closure of this left knee I began to work on his right knee, recorded under separate templated operative note.   We will initiate physical therapy and progress to ambulate.     Madlyn FrankelMatthew D. Charlann Boxerlin, M.D.

## 2015-10-31 NOTE — Anesthesia Postprocedure Evaluation (Signed)
Anesthesia Post Note  Patient: Russell [name]  Procedure(s) Performed: Procedure(s) (LRB): UNICOMPARTMENTAL KNEE BILATERAL MEDIALLY (Bilateral)  Patient location during evaluation: PACU Anesthesia Type: Spinal and MAC Level of consciousness: awake and alert Pain management: pain level controlled Vital Signs Assessment: post-procedure vital signs reviewed and stable Respiratory status: spontaneous breathing and respiratory function stable Cardiovascular status: blood pressure returned to baseline and stable Postop Assessment: spinal receding Anesthetic complications: no    Last Vitals:  Filed Vitals:   10/31/15 1200 10/31/15 1258  BP: 137/77 140/84  Pulse: 86 71  Temp: 36.4 C 36.5 C  Resp: 16 15    Last Pain:  Filed Vitals:   10/31/15 1300  PainSc: 0-No pain                 Hildegarde Dunaway DANIEL

## 2015-10-31 NOTE — Interval H&P Note (Signed)
History and Physical Interval Note:  10/31/2015 7:24 AM  Russell BrunsJeffrey O [name]  has presented today for surgery, with the diagnosis of bilateral knee medial compartimental OA  The various methods of treatment have been discussed with the patient and family. After consideration of risks, benefits and other options for treatment, the patient has consented to  Procedure(s): UNICOMPARTMENTAL KNEE BILATERAL MEDIALLY (Bilateral) as a surgical intervention .  The patient's history has been reviewed, patient examined, no change in status, stable for surgery.  I have reviewed the patient's chart and labs.  Questions were answered to the patient's satisfaction.     Shelda PalLIN,Chris Cripps D

## 2015-10-31 NOTE — Op Note (Signed)
NAME: Russell [name]    MEDICAL RECORD NO.: [id number]   FACILITY: [facility]   DATE OF BIRTH: June 08, 1961  PHYSICIAN: Madlyn Frankel. Charlann Boxer, M.D.    DATE OF PROCEDURE: 10/31/2015    OPERATIVE REPORT   PREOPERATIVE DIAGNOSIS: Right knee medial compartment osteoarthritis.   POSTOPERATIVE DIAGNOSIS: Right knee medial compartment osteoarthritis.  PROCEDURE: Right partial knee replacement utilizing Biomet Oxford knee  component, size Medium femur, a right medial size C tibial tray with a size 5 mm insert.   SURGEON: Madlyn Frankel. Charlann Boxer, M.D.   ASSISTANT: Lanney Gins, PAC.  Please note that [name] was present for the entirety of the case,  utilized for preoperative positioning, perioperative retractor  management, general facilitation of the case and primary wound closure.   ANESTHESIA: General.   SPECIMENS: None.   COMPLICATIONS: None.  DRAINS: None   TOURNIQUET TIME: 30 minutes at 250 mmHg.   INDICATIONS FOR PROCEDURE: The patient is a 54 y.o. patient of mine who presented for evaluation of right knee pain.  They presented with primary complaints of pain on the medial side of their knee. Radiographs revealed advanced medial compartment arthritis with specifically an antero-medial wear pattern.  There was bone on bone changes noted with subchondral sclerosis and osteophytes present. The patient has had progressive problems failing to respond to conservative measures of medications, injections and activity modification. Risks of infection, DVT, component failure, need for future revision surgery were all discussed and reviewed.  Consent was obtained for benefit of pain relief.   PROCEDURE IN DETAIL: The patient was brought to the operative theater.  Once adequate anesthesia, preoperative antibiotics, 3gm of Ancef, 1 gm of Tranexamic Acid, and 10 mg of Decadron at the beginning of the bilateral procedure administered, the patient was positioned in supine position with a right thigh  tourniquet  placed. The right lower extremity was prepped and draped in sterile  fashion with the leg on the Oxford leg holder.  The leg was allowed to flex to 120 degrees. A time-out  was performed identifying the patient, planned procedure, and extremity.  The leg was exsanguinated, tourniquet elevated to 250 mmHg. A midline  incision was made from the proximal pole of the patella to the tibial tubercle. A  soft tissue plane was created and partial median arthrotomy was then  made to allow for subluxation of the patella. Following initial synovectomy and  debridement, the osteophytes were removed off the medial aspect of the  knee.   Attention was first directed to the tibia. The tibial  extramedullary guide was positioned over the anterior crest of the tibia  and pinned into position, and using a measured resection guide from the  Oxford system, a 4 mm resection was made off the proximal tibia. First  the reciprocating saw along the medial aspect of the tibial spines, then the oscillating saw.    At this point, I sized this cut surface seem to be best fit for a size C tibial tray.  With the retractors out of the wound and the knee held at 90 degrees the 4 feeler gauge had appropriate tension on the medial ligament.   At this point, the femoral canal was opened with a drill and the  intramedullary rod passed. Then using the guide for a medium posterior resection off  the posterior aspect of the femur was positioned over the mid portion of the medial femoral condyle.  The orientation was set using the guide that mates the femoral guide to the  intramedullary rod.  The 2 drill holes were made into the distal femur.  The posterior guide was then impacted into place and the posterior  femoral cut made.  At this point, I milled the distal femur with a size 4 spigot in place. At this point, we did a trial reduction of the medium femur, size C tibial tray and first the 4 then 5 feeler gauge  insert. At 90 degrees of flexion the medial ligament was stable however again at 20 degrees of flexion the knee the size 4 feeler gauge was more appropriate for ligament tension.  Given the difference in the tension between the knee in 90 degrees versus that in 20 degrees I had to place the 5 spigot into the femur and re-mill the distal femur.  Remaining bone was removed and debrided.  I repeated the trial reduction and found that now at both 90 degrees and 20 degrees the knee ligament were tension symmetrically with the 5 feeler gauge.  Given these findings, the trial femoral component was removed. Final preparation of tibia was carried out by pinning it in position. Then  using a reciprocating saw I removed bone for the keel. Further bone was  removed with an osteotome.  Trial reduction was now carried out with the medium femur, the size C keeled tibia, and the size 5 lollipop insert. The balance of the  ligaments appeared to be symmetric at 20 degrees and 90 degrees. Given  all these findings, the trial components were removed.   Cement was mixed. The final components were opened. The knee was irrigated with  normal saline solution. Then final debridements of the  soft tissue was carried out, I also drilled the sclerotic bone with a drill.  The final components were cemented with a single batch of cement in a  two-stage technique with the tibial component cemented first. The knee  was then brought  to 45 degrees of flexion with a size 5 feeler gauge, held with pressure for a minute and half.  After this the femoral component was cemented in place.  The knee was again held at 45 degrees of flexion while the cement fully cured.  Excess cement was removed throughout the knee. Tourniquet was let down  after 30 minutes. After the cement had fully cured and excessive cement  was removed throughout the knee there was no visualized cement present.   The final size 5 mm insert for this right medial  knee to match the medium femur was chosen and snapped into position. We re-irrigated  the knee. The extensor mechanism  was then reapproximated using a #1 Vicryl and #0 Quill suture with the knee in flexion. The  remaining wound was closed with 2-0 Vicryl and a running 4-0 Monocryl.  The knee was cleaned, dried, and dressed sterilely using Dermabond and  Aquacel dressing.    At the conclusion of this second knee the patient  was brought to the recovery room, Ace wrap in place, tolerating the  procedure well. He will be in the hospital for overnight observation.   We will initiate physical therapy and progress to ambulate.     Madlyn FrankelMatthew D. Charlann Boxerlin, M.D.

## 2015-10-31 NOTE — Transfer of Care (Signed)
Immediate Anesthesia Transfer of Care Note  Patient: Russell BrunsJeffrey O Godshall  Procedure(s) Performed: Procedure(s): UNICOMPARTMENTAL KNEE BILATERAL MEDIALLY (Bilateral)  Patient Location: PACU  Anesthesia Type:Spinal  Level of Consciousness:  sedated, patient cooperative and responds to stimulation  Airway & Oxygen Therapy:Patient Spontanous Breathing and Patient connected to face mask oxgen  Post-op Assessment:  Report given to PACU RN and Post -op Vital signs reviewed and stable  Post vital signs:  Reviewed and stable  Last Vitals:  Filed Vitals:   10/31/15 0539  BP: 147/95  Pulse: 85  Temp: 36.7 C  Resp: 18    Complications: No apparent anesthesia complications

## 2015-10-31 NOTE — Anesthesia Procedure Notes (Addendum)
Spinal Patient location during procedure: OR Start time: 10/31/2015 7:38 AM End time: 10/31/2015 7:45 AM Staffing Anesthesiologist: Duane Boston Resident/CRNA: Darlys Gales R Performed by: resident/CRNA  Preanesthetic Checklist Completed: patient identified, site marked, surgical consent, pre-op evaluation, timeout performed, IV checked, risks and benefits discussed and monitors and equipment checked Spinal Block Patient position: sitting Prep: ChloraPrep Patient monitoring: heart rate, continuous pulse ox and blood pressure Approach: midline Location: L3-4 Injection technique: single-shot Needle Needle type: Spinocan  Needle gauge: 24 G Needle length: 12.7 cm Needle insertion depth: 11 cm Assessment Sensory level: T8 Additional Notes Expiration date of kit checked and confirmed. Patient tolerated procedure well, without complications. Thick layer of subcutaneous/adipose tissue, 10 cm needle too short. Lot [id number] Exp 2017-03-01

## 2015-11-01 DIAGNOSIS — M170 Bilateral primary osteoarthritis of knee: Secondary | ICD-10-CM | POA: Diagnosis not present

## 2015-11-01 LAB — BASIC METABOLIC PANEL
ANION GAP: 9 (ref 5–15)
BUN: 20 mg/dL (ref 6–20)
CHLORIDE: 101 mmol/L (ref 101–111)
CO2: 30 mmol/L (ref 22–32)
Calcium: 8.9 mg/dL (ref 8.9–10.3)
Creatinine, Ser: 1.05 mg/dL (ref 0.61–1.24)
GFR calc non Af Amer: >60 mL/min (ref >60)
Glucose, Bld: 250 mg/dL — ABNORMAL HIGH (ref 65–99)
POTASSIUM: 4.8 mmol/L (ref 3.5–5.1)
Sodium: 140 mmol/L (ref 135–145)

## 2015-11-01 LAB — CBC
HEMATOCRIT: 40.2 % (ref 39.0–52.0)
HEMOGLOBIN: 13.9 g/dL (ref 13.0–17.0)
MCH: 32.8 pg (ref 26.0–34.0)
MCHC: 34.6 g/dL (ref 30.0–36.0)
MCV: 94.8 fL (ref 78.0–100.0)
Platelets: 177 10*3/uL (ref 150–400)
RBC: 4.24 MIL/uL (ref 4.22–5.81)
RDW: 14 % (ref 11.5–15.5)
WBC: 14.5 10*3/uL — AB (ref 4.0–10.5)

## 2015-11-01 MED ORDER — ASPIRIN EC 325 MG PO TBEC: 325.0000 mg | DELAYED_RELEASE_TABLET | Freq: Two times a day (BID) | ORAL | Status: AC

## 2015-11-01 MED ORDER — RIVAROXABAN 10 MG PO TABS: 10.0000 mg | ORAL_TABLET | Freq: Every day | ORAL | Status: DC

## 2015-11-01 MED ORDER — DOCUSATE SODIUM 100 MG PO CAPS: 100.0000 mg | ORAL_CAPSULE | Freq: Two times a day (BID) | ORAL | Status: DC

## 2015-11-01 MED ORDER — OXYCODONE HCL 5 MG PO TABS: 5.0000 mg | ORAL_TABLET | ORAL | Status: DC | PRN

## 2015-11-01 MED ORDER — POLYETHYLENE GLYCOL 3350 17 G PO PACK: 17.0000 g | PACK | Freq: Two times a day (BID) | ORAL | Status: DC

## 2015-11-01 MED ORDER — CELECOXIB 200 MG PO CAPS: 200.0000 mg | ORAL_CAPSULE | Freq: Two times a day (BID) | ORAL | Status: DC

## 2015-11-01 MED ORDER — METHOCARBAMOL 500 MG PO TABS: 500.0000 mg | ORAL_TABLET | Freq: Four times a day (QID) | ORAL | Status: DC | PRN

## 2015-11-01 MED ORDER — FERROUS SULFATE 325 (65 FE) MG PO TABS: 325.0000 mg | ORAL_TABLET | Freq: Three times a day (TID) | ORAL | Status: DC

## 2015-11-01 NOTE — Discharge Instructions (Addendum)
Information on my medicine - XARELTO (Rivaroxaban)  This medication education was reviewed with me or my healthcare representative as part of my discharge preparation.  The pharmacist that spoke with me during my hospital stay was:  Absher, Ky Barban, RPH  Why was Xarelto prescribed for you? Xarelto was prescribed for you to reduce the risk of blood clots forming after orthopedic surgery. The medical term for these abnormal blood clots is venous thromboembolism (VTE).  What do you need to know about xarelto ? Take your Xarelto ONCE DAILY at the same time every day. You may take it either with or without food.  If you have difficulty swallowing the tablet whole, you may crush it and mix in applesauce just prior to taking your dose.  Take Xarelto exactly as prescribed by your doctor and DO NOT stop taking Xarelto without talking to the doctor who prescribed the medication.  Stopping without other VTE prevention medication to take the place of Xarelto may increase your risk of developing a clot.  After discharge, you should have regular check-up appointments with your healthcare provider that is prescribing your Xarelto.    What do you do if you miss a dose? If you miss a dose, take it as soon as you remember on the same day then continue your regularly scheduled once daily regimen the next day. Do not take two doses of Xarelto on the same day.   Important Safety Information A possible side effect of Xarelto is bleeding. You should call your healthcare provider right away if you experience any of the following: ? Bleeding from an injury or your nose that does not stop. ? Unusual colored urine (red or dark brown) or unusual colored stools (red or black). ? Unusual bruising for unknown reasons. ? A serious fall or if you hit your head (even if there is no bleeding).  Some medicines may interact with Xarelto and might increase your risk of bleeding while on Xarelto. To help avoid  this, consult your healthcare provider or pharmacist prior to using any new prescription or non-prescription medications, including herbals, vitamins, non-steroidal anti-inflammatory drugs (NSAIDs) and supplements.  This website has more information on Xarelto: VisitDestination.com.br.  ___________________________________________________________________________________    INSTRUCTIONS AFTER JOINT REPLACEMENT   o Remove items at home which could result in a fall. This includes throw rugs or furniture in walking pathways o ICE to the affected joint every three hours while awake for 30 minutes at a time, for at least the first 3-5 days, and then as needed for pain and swelling.  Continue to use ice for pain and swelling. You may notice swelling that will progress down to the foot and ankle.  This is normal after surgery.  Elevate your leg when you are not up walking on it.   o Continue to use the breathing machine you got in the hospital (incentive spirometer) which will help keep your temperature down.  It is common for your temperature to cycle up and down following surgery, especially at night when you are not up moving around and exerting yourself.  The breathing machine keeps your lungs expanded and your temperature down.   DIET:  As you were doing prior to hospitalization, we recommend a well-balanced diet.  DRESSING / WOUND CARE / SHOWERING  Keep the surgical dressing until follow up.  The dressing is water proof, so you can shower without any extra covering.  IF THE DRESSING FALLS OFF or the wound gets wet inside, change the dressing  with sterile gauze.  Please use good hand washing techniques before changing the dressing.  Do not use any lotions or creams on the incision until instructed by your surgeon.    ACTIVITY  o Increase activity slowly as tolerated, but follow the weight bearing instructions below.   o No driving for 6 weeks or until further direction given by your physician.  You  cannot drive while taking narcotics.  o No lifting or carrying greater than 10 lbs. until further directed by your surgeon. o Avoid periods of inactivity such as sitting longer than an hour when not asleep. This helps prevent blood clots.  o You may return to work once you are authorized by your doctor.     WEIGHT BEARING   Weight bearing as tolerated with assist device (walker, cane, etc) as directed, use it as long as suggested by your surgeon or therapist, typically at least 4-6 weeks.   EXERCISES  Results after joint replacement surgery are often greatly improved when you follow the exercise, range of motion and muscle strengthening exercises prescribed by your doctor. Safety measures are also important to protect the joint from further injury. Any time any of these exercises cause you to have increased pain or swelling, decrease what you are doing until you are comfortable again and then slowly increase them. If you have problems or questions, call your caregiver or physical therapist for advice.   Rehabilitation is important following a joint replacement. After just a few days of immobilization, the muscles of the leg can become weakened and shrink (atrophy).  These exercises are designed to build up the tone and strength of the thigh and leg muscles and to improve motion. Often times heat used for twenty to thirty minutes before working out will loosen up your tissues and help with improving the range of motion but do not use heat for the first two weeks following surgery (sometimes heat can increase post-operative swelling).   These exercises can be done on a training (exercise) mat, on the floor, on a table or on a bed. Use whatever works the best and is most comfortable for you.    Use music or television while you are exercising so that the exercises are a pleasant break in your day. This will make your life better with the exercises acting as a break in your routine that you can look  forward to.   Perform all exercises about fifteen times, three times per day or as directed.  You should exercise both the operative leg and the other leg as well.  Exercises include:    Quad Sets - Tighten up the muscle on the front of the thigh (Quad) and hold for 5-10 seconds.    Straight Leg Raises - With your knee straight (if you were given a brace, keep it on), lift the leg to 60 degrees, hold for 3 seconds, and slowly lower the leg.  Perform this exercise against resistance later as your leg gets stronger.   Leg Slides: Lying on your back, slowly slide your foot toward your buttocks, bending your knee up off the floor (only go as far as is comfortable). Then slowly slide your foot back down until your leg is flat on the floor again.   Angel Wings: Lying on your back spread your legs to the side as far apart as you can without causing discomfort.   Hamstring Strength:  Lying on your back, push your heel against the floor with your leg straight  by tightening up the muscles of your buttocks.  Repeat, but this time bend your knee to a comfortable angle, and push your heel against the floor.  You may put a pillow under the heel to make it more comfortable if necessary.   A rehabilitation program following joint replacement surgery can speed recovery and prevent re-injury in the future due to weakened muscles. Contact your doctor or a physical therapist for more information on knee rehabilitation.    CONSTIPATION  Constipation is defined medically as fewer than three stools per week and severe constipation as less than one stool per week.  Even if you have a regular bowel pattern at home, your normal regimen is likely to be disrupted due to multiple reasons following surgery.  Combination of anesthesia, postoperative narcotics, change in appetite and fluid intake all can affect your bowels.   YOU MUST use at least one of the following options; they are listed in order of increasing strength  to get the job done.  They are all available over the counter, and you may need to use some, POSSIBLY even all of these options:    Drink plenty of fluids (prune juice may be helpful) and high fiber foods Colace 100 mg by mouth twice a day  Senokot for constipation as directed and as needed Dulcolax (bisacodyl), take with full glass of water  Miralax (polyethylene glycol) once or twice a day as needed.  If you have tried all these things and are unable to have a bowel movement in the first 3-4 days after surgery call either your surgeon or your primary doctor.    If you experience loose stools or diarrhea, hold the medications until you stool forms back up.  If your symptoms do not get better within 1 week or if they get worse, check with your doctor.  If you experience "the worst abdominal pain ever" or develop nausea or vomiting, please contact the office immediately for further recommendations for treatment.   ITCHING:  If you experience itching with your medications, try taking only a single pain pill, or even half a pain pill at a time.  You can also use Benadryl over the counter for itching or also to help with sleep.   TED HOSE STOCKINGS:  Use stockings on both legs until for at least 2 weeks or as directed by physician office. They may be removed at night for sleeping.  MEDICATIONS:  See your medication summary on the After Visit Summary that nursing will review with you.  You may have some home medications which will be placed on hold until you complete the course of blood thinner medication.  It is important for you to complete the blood thinner medication as prescribed.  PRECAUTIONS:  If you experience chest pain or shortness of breath - call 911 immediately for transfer to the hospital emergency department.   If you develop a fever greater that 101 F, purulent drainage from wound, increased redness or drainage from wound, foul odor from the wound/dressing, or calf pain - CONTACT  YOUR SURGEON.                                                   FOLLOW-UP APPOINTMENTS:  If you do not already have a post-op appointment, please call the office for an appointment to be seen by your surgeon.  Guidelines for how soon to be seen are listed in your “After Visit Summary”, but are typically between 1-4 weeks after surgery. ° °OTHER INSTRUCTIONS:  ° °Knee Replacement:  Do not place pillow under knee, focus on keeping the knee straight while resting.  ° °MAKE SURE YOU:  °• Understand these instructions.  °• Get help right away if you are not doing well or get worse.  ° ° °Thank you for letting us be a part of your medical care team.  It is a privilege we respect greatly.  We hope these instructions will help you stay on track for a fast and full recovery!  °  °

## 2015-11-01 NOTE — Care Management Note (Signed)
Case Management Note  Patient Details  Name: Russell [name] MRN: [id number] Date of Birth: 09/20/1961  Subjective/Objective:    s/p bilateral unicompartmental knee medially                Action/Plan: Discharge planning, no HH needs identified, per nurse and attending no need for HHPT, did request RW and 3-n-1. Contacted [facility] to deliver to room.   Expected Discharge Date:                  Expected Discharge Plan:  Home/Self Care  In-House Referral:  NA  Discharge planning Services  CM Consult  Post Acute Care Choice:  Durable Medical Equipment Choice offered to:  NA  DME Arranged:  3-N-1, Walker wide DME Agency:  [facility]  HH Arranged:  NA HH Agency:  NA  Status of Service:  Completed, signed off  Medicare Important Message Given:    Date Medicare IM Given:    Medicare IM give by:    Date Additional Medicare IM Given:    Additional Medicare Important Message give by:     If discussed at Long Length of Stay Meetings, dates discussed:    Additional Comments:  [name], [name] K, RN 11/01/2015, 3:46 PM

## 2015-11-01 NOTE — Progress Notes (Signed)
Occupational Therapy Treatment Patient Details Name: Russell [name] MRN: [id number] DOB: [birth year] Today's Date: 11/01/2015    History of present illness s/p bil UKR   OT comments  Had not planned to see pt again; however, he asked to see standard vs wide commode which was delivered to room--tried both.  He decided to keep wide model.  He does not need any further OT at this time  Follow Up Recommendations  No OT follow up;Supervision/Assistance - 24 hour    Equipment Recommendations  3 in 1 bedside comode, wide   Recommendations for Other Services      Precautions / Restrictions Precautions Precautions: Fall;Knee Restrictions Weight Bearing Restrictions: No       Mobility Bed Mobility               General bed mobility comments: oob  Transfers   Equipment used: Rolling walker (2 wheeled) Transfers: Sit to/from Stand Sit to Stand: Min guard         General transfer comment: for safety    Balance                                   ADL Overall ADL's : Needs assistance/impaired                         Toilet Transfer: Min guard;BSC;RW;Ambulation       Tub/ Banker: Min guard;Ambulation;Walk-in shower     General ADL Comments: Pt bought a standard sock aide from the gift shop as they don't carry wide ones.  When I stopped by to exchange this for a wide model, he had wide 3:1 delivered to his room.  He was not sure that this would fit over his toilet.  He asked to see a standard model, and I brought this by.  He transferred onto both and decided to stay with the wide model, which is a better fit for his body. Min guard for both transfers for safety      Vision                     Perception     Praxis      Cognition   Behavior During Therapy: Russell [name] for tasks assessed/performed Overall Cognitive Status: Within Functional Limits for tasks assessed                       Extremity/Trunk Assessment   Upper Extremity Assessment Upper Extremity Assessment: Overall WFL for tasks assessed            Exercises     Shoulder Instructions       General Comments      Pertinent Vitals/ Pain       Pain Assessment: 0-10 Pain Score: 2  Pain Location: bil knees Pain Descriptors / Indicators: Sore Pain Intervention(s): Limited activity within patient's tolerance;Monitored during session;Premedicated before session;Repositioned;Ice applied  Home Living Family/patient expects to be discharged to:: Private residence Living Arrangements: Spouse/significant other                 Bathroom Shower/Tub: Occupational psychologist: Standard     Home Equipment: None          Prior Functioning/Environment Level of Independence: Independent            Frequency     Goal:  Pt will  transfer to 3:1 commode at supervision level  Progress Toward Goals  OT Goals(current goals can now be found in the care plan section)  Progress towards OT goals:  (goal set for this session; not met; however pt does not need further OT)  Acute Rehab OT Goals Patient Stated Goal: home as soon as possible OT Goal Formulation: All assessment and education complete, DC therapy  Plan      Co-evaluation                 End of Session     Activity Tolerance Patient tolerated treatment well   Patient Left in chair;with call bell/phone within reach;with family/visitor present   Nurse Communication         Time: 1130-1139 OT Time Calculation (min): 9 min  Charges: OT G-codes **NOT FOR INPATIENT CLASS** Functional Assessment Tool Used: clinical observation and judgment Functional Limitation: Self care Self Care Discharge Status (F9094): At least 1 percent but less than 20 percent impaired, limited or restricted OT General Charges $OT Visit: 1 Procedure  OT Treatments $Self Care/Home Management : 8-22 mins  Russell [name] 11/01/2015, 12:35 PM  Lesle Chris,  OTR/L [phone] 11/01/2015

## 2015-11-01 NOTE — Progress Notes (Signed)
Patient ID: Max SaneJeffrey O Yaffe, male   DOB: 06/17/1961, 54 y.o.   MRN: [id number] Subjective: 1 Day Post-Op Procedure(s) (LRB): UNICOMPARTMENTAL KNEE BILATERAL MEDIALLY (Bilateral)    Patient reports pain as mild to moderate.  Doing well, no events.    Objective:   VITALS:   Filed Vitals:   11/01/15 0559 11/01/15 1000  BP: 127/80 126/75  Pulse:  84  Temp: 98.4 F (36.9 C) 98.4 F (36.9 C)  Resp: 16 20    Neurovascular intact Incision: dressing C/D/I, bilateral knee  LABS  Recent Labs  11/01/15 0500  HGB 13.9  HCT 40.2  WBC 14.5*  PLT 177     Recent Labs  11/01/15 0500  NA 140  K 4.8  BUN 20  CREATININE 1.05  GLUCOSE 250*    No results for input(s): LABPT, INR in the last 72 hours.   Assessment/Plan: 1 Day Post-Op Procedure(s) (LRB): UNICOMPARTMENTAL KNEE BILATERAL MEDIALLY (Bilateral)   Advance diet Up with therapy  Home today with out-patient therapy   Reviewed course, happy thus far

## 2015-11-01 NOTE — Progress Notes (Signed)
Physical Therapy Treatment Note    11/01/15 1247  PT Visit Information  Last PT Received On 11/01/15  Assistance Needed +1  History of Present Illness Pt is a 54 year old male s/p bilateral unicompartmental knee medially  PT Time Calculation  PT Start Time (ACUTE ONLY) 1221  PT Stop Time (ACUTE ONLY) 1231  PT Time Calculation (min) (ACUTE ONLY) 10 min  Subjective Data  Subjective Pt ambulated again in hallway and progressing well with gait.  Pt plans to perform exercises at home until getting to outpatient PT next week.  Provided HEP handout.  Pt and spouse had no further questions and pt feels ready for d/c home today.  Precautions  Precautions Fall;Knee  Restrictions  Other Position/Activity Restrictions WBAT bilaterally  Pain Assessment  Pain Assessment 0-10  Pain Score 2  Pain Location bil knees  Pain Descriptors / Indicators Tightness;Sore  Pain Intervention(s) Limited activity within patient's tolerance;Monitored during session  Cognition  Arousal/Alertness Awake/alert  Behavior During Therapy WFL for tasks assessed/performed  Overall Cognitive Status Within Functional Limits for tasks assessed  Bed Mobility  General bed mobility comments pt up in recliner  Transfers  Overall transfer level Needs assistance  Equipment used Rolling walker (2 wheeled)  Sit to Stand Supervision  General transfer comment verbal cues for LE placement  Ambulation/Gait  Ambulation/Gait assistance Supervision  Ambulation Distance (Feet) 200 Feet  Assistive device Rolling walker (2 wheeled)  General Gait Details verbal cues for increasing knee flexion (hx of bil achilles tendon replacements limiting DF ability)  Gait velocity decreased  PT - End of Session  Activity Tolerance Patient tolerated treatment well  Patient left with call bell/phone within reach;with family/visitor present (heading into bathroom with spouse assist)  PT - Assessment/Plan  PT Plan Current plan remains appropriate   PT Frequency (ACUTE ONLY) 7X/week  Follow Up Recommendations Outpatient PT  PT equipment Rolling walker with 5" wheels  PT Goal Progression  Progress towards PT goals Progressing toward goals  PT General Charges  $$ ACUTE PT VISIT 1 Procedure  PT Treatments  $Gait Training 8-22 mins   Zenovia JarredKati Laquan Beier, PT, DPT 11/01/2015 Pager: [phone]

## 2015-11-01 NOTE — Discharge Summary (Signed)
Physician Discharge Summary  Patient ID: Russell [name] MRN: [id number] DOB/AGE: Jan 01, 1961 54 y.o.  Admit date: 10/31/2015 Discharge date: 11/01/2015   Procedures:  Procedure(s) (LRB): UNICOMPARTMENTAL KNEE BILATERAL MEDIALLY (Bilateral)  Attending Physician:  Dr. Durene Romans   Admission Diagnoses:   Bilateral knee medial compartmental primary OA /pain  Discharge Diagnoses:  Principal Problem:   S/P bilateral UKR  Past Medical History  Diagnosis Date  . PONV (postoperative nausea and vomiting)   . Wears glasses   . Abnormality of gait and mobility   . History of kidney stones   . Indigestion   . Arthritis   . Pneumonia     history of times one - 2010    HPI:    Russell [name], 54 y.o. male male, has a history of pain and functional disability in the bilateral and has failed non-surgical conservative treatments for greater than 12 weeks to include NSAID's and/or analgesics, corticosteriod injections, viscosupplementation injections and activity modification. Onset of symptoms was gradual, starting >10 years ago with gradually worsening course since that time. The patient noted prior procedures on the knee to include ACL reconstruction on the left knee(s). Patient currently rates pain in the bilateral knee(s) at 9 out of 10 with activity. Patient has worsening of pain with activity and weight bearing, pain that interferes with activities of daily living, pain with passive range of motion, crepitus and joint swelling. Patient has evidence of periarticular osteophytes and joint space narrowing of the medial compartment by imaging studies. There is no active infection. Risks, benefits and expectations were discussed with the patient. Risks including but not limited to the risk of anesthesia, blood clots, nerve damage, blood vessel damage, failure of the prosthesis, infection and up to and including death. Patient understand the risks, benefits and expectations and  wishes to proceed with surgery.   PCP: Donovan Kail, MD   Discharged Condition: good  Hospital Course:  Patient underwent the above stated procedure on 10/31/2015. Patient tolerated the procedure well and brought to the recovery room in good condition and subsequently to the floor.  POD #1 BP: 126/75 ; Pulse: 84 ; Temp: 98.4 F (36.9 C) ; Resp: 20 Patient reports pain as mild to moderate. Doing well, no events. Neurovascular intact and incision: dressing C/D/I, bilateral knee  LABS  Basename    HGB     13.9  HCT     40.2    Discharge Exam: General appearance: alert, cooperative and no distress Extremities: Homans sign is negative, no sign of DVT, no edema, redness or tenderness in the calves or thighs and no ulcers, gangrene or trophic changes  Disposition: Home with follow up in 2 weeks   Follow-up Information    Follow up with Shelda Pal, MD. Schedule an appointment as soon as possible for a visit in 2 weeks.   Specialty:  Orthopedic Surgery   Contact information:   [address] [place] [address] [phone]       Follow up with [facility].   Why:  rolling walker and 3-n-1   Contact information:   [address] [place] [address] [phone]       Discharge Instructions    Call MD / Call 911    Complete by:  As directed   If you experience chest pain or shortness of breath, CALL 911 and be transported to the hospital emergency room.  If you develope a fever above 101 F, pus (  white drainage) or increased drainage or redness at the wound, or calf pain, call your surgeon's office.     Change dressing    Complete by:  As directed   Maintain surgical dressing until follow up in the clinic. If the edges start to pull up, may reinforce with tape. If the dressing is no longer working, may remove and cover with gauze and tape, but must keep the area dry and clean.  Call with any questions or concerns.      Constipation Prevention    Complete by:  As directed   Drink plenty of fluids.  Prune juice may be helpful.  You may use a stool softener, such as Colace (over the counter) 100 mg twice a day.  Use MiraLax (over the counter) for constipation as needed.     Diet - low sodium heart healthy    Complete by:  As directed      Discharge instructions    Complete by:  As directed   Maintain surgical dressing until follow up in the clinic. If the edges start to pull up, may reinforce with tape. If the dressing is no longer working, may remove and cover with gauze and tape, but must keep the area dry and clean.  [facility]. Call with any questions or concerns.     Increase activity slowly as tolerated    Complete by:  As directed   Weight bearing as tolerated with assist device (walker, cane, etc) as directed, use it as long as suggested by your surgeon or therapist, typically at least 4-6 weeks.     TED hose    Complete by:  As directed   Use stockings (TED hose) for 2 weeks on both leg(s).  You may remove them at night for sleeping.             Medication List    STOP taking these medications        ibuprofen 200 MG tablet  Commonly known as:  ADVIL,MOTRIN      TAKE these medications        aspirin EC 325 MG tablet  Take 1 tablet (325 mg total) by mouth 2 (two) times daily. Take for 4 weeks.  Start taking on:  11/17/2015     celecoxib 200 MG capsule  Commonly known as:  CELEBREX  Take 1 capsule (200 mg total) by mouth every 12 (twelve) hours.     docusate sodium 100 MG capsule  Commonly known as:  COLACE  Take 1 capsule (100 mg total) by mouth 2 (two) times daily.     ferrous sulfate 325 (65 FE) MG tablet  Take 1 tablet (325 mg total) by mouth 3 (three) times daily after meals.     methocarbamol 500 MG tablet  Commonly known as:  ROBAXIN  Take 1 tablet (500 mg total) by mouth every 6 (six) hours as needed for muscle spasms.     oxyCODONE 5  MG immediate release tablet  Commonly known as:  Oxy IR/ROXICODONE  Take 1-2 tablets (5-10 mg total) by mouth every 4 (four) hours as needed for breakthrough pain.     polyethylene glycol packet  Commonly known as:  MIRALAX / GLYCOLAX  Take 17 g by mouth 2 (two) times daily.     rivaroxaban 10 MG Tabs tablet  Commonly known as:  XARELTO  Take 1 tablet (10 mg total) by mouth daily with breakfast.  Start taking on:  11/02/2015     testosterone cypionate 200 MG/ML injection  Commonly known as:  DEPOTESTOSTERONE CYPIONATE  Inject 100 mg into the muscle every 14 (fourteen) days.         Signed: Anastasio Auerbach. Lizzette Carbonell   PA-C  11/01/2015, 6:16 PM

## 2015-11-01 NOTE — Evaluation (Addendum)
Occupational Therapy Evaluation Patient Details Name: Russell SaneJeffrey O Tavella MRN: [id number] DOB: 06/10/1961 Today's Date: 11/01/2015    History of Present Illness s/p bil UKR   Clinical Impression   This 54 year old man was admitted for the above surgeries. All education was completed.  No further OT is needed at this time.    Follow Up Recommendations  No OT follow up;Supervision/Assistance - 24 hour    Equipment Recommendations  3 in 1 bedside comode    Recommendations for Other Services       Precautions / Restrictions Precautions Precautions: Fall;Knee Restrictions Weight Bearing Restrictions: No      Mobility Bed Mobility               General bed mobility comments: oob  Transfers   Equipment used: Rolling walker (2 wheeled) Transfers: Sit to/from Stand Sit to Stand: Min guard         General transfer comment: for safety    Balance                                            ADL Overall ADL's : Needs assistance/impaired                         Toilet Transfer: Min guard;BSC;RW;Ambulation       Tub/ Engineer, structuralhower Transfer: Min guard;Ambulation;Walk-in shower     General ADL Comments: educated on AE:  pt is able to perform ADLs with set up and min guard for sit to stand using this. Wife will get sock aide. They have a reacher and will get a netted sponge on a stick     Vision     Perception     Praxis      Pertinent Vitals/Pain Pain Assessment: 0-10 Pain Score: 2  Pain Location: bil knees Pain Descriptors / Indicators: Sore Pain Intervention(s): Limited activity within patient's tolerance;Monitored during session;Premedicated before session;Repositioned;Ice applied     Hand Dominance     Extremity/Trunk Assessment Upper Extremity Assessment Upper Extremity Assessment: Overall WFL for tasks assessed           Communication Communication Communication: No difficulties   Cognition Arousal/Alertness:  Awake/alert Behavior During Therapy: WFL for tasks assessed/performed Overall Cognitive Status: Within Functional Limits for tasks assessed                     General Comments       Exercises       Shoulder Instructions      Home Living Family/patient expects to be discharged to:: Private residence Living Arrangements: Spouse/significant other                 Bathroom Shower/Tub: Producer, television/film/videoWalk-in shower   Bathroom Toilet: Standard     Home Equipment: None          Prior Functioning/Environment Level of Independence: Independent             OT Diagnosis: Generalized weakness   OT Problem List:     OT Treatment/Interventions:      OT Goals(Current goals can be found in the care plan section) Acute Rehab OT Goals Patient Stated Goal: home as soon as possible OT Goal Formulation: All assessment and education complete, DC therapy  OT Frequency:     Barriers to D/C:  Co-evaluation              End of Session    Activity Tolerance: Patient tolerated treatment well Patient left: in chair;with call bell/phone within reach;with family/visitor present   Time: 1130-1139 OT Time Calculation (min): 9 min Charges:  OT General Charges $OT Visit: 1 Procedure OT Evaluation $Initial OT Evaluation Tier I: 1 Procedure  G-Codes: OT G-codes **NOT FOR INPATIENT CLASS** Functional Assessment Tool Used: clinical observation and judgment Functional Limitation: Self care Self Care Current Status (Z6109): At least 1 percent but less than 20 percent impaired, limited or restricted Self Care Goal Status (U0454): At least 1 percent but less than 20 percent impaired, limited or restricted  [facility] 11/01/2015, 12:26 PM Marica Otter, OTR/L [phone] 11/01/2015

## 2015-11-01 NOTE — Progress Notes (Signed)
Physical Therapy Treatment Patient Details Name: Max SaneJeffrey O Zarcone MRN: [id number] DOB: 01/08/1961 Today's Date: 11/01/2015    History of Present Illness Pt is a 54 year old male s/p bilateral unicompartmental knee medially    PT Comments    Pt ambulated in hallway and performed bilateral LE exercises.  Pt to d/c this afternoon after second session.  Follow Up Recommendations   Outpatient PT     Equipment Recommendations  Rolling walker with 5" wheels    Recommendations for Other Services       Precautions / Restrictions Precautions Precautions: Fall;Knee Restrictions Weight Bearing Restrictions: No    Mobility  Bed Mobility Overal bed mobility: Needs Assistance Bed Mobility: Supine to Sit     Supine to sit: Supervision;HOB elevated     General bed mobility comments: oob  Transfers Overall transfer level: Needs assistance Equipment used: Rolling walker (2 wheeled) Transfers: Sit to/from Stand Sit to Stand: Min guard         General transfer comment: verbal cues for LE placement  Ambulation/Gait Ambulation/Gait assistance: Min guard Ambulation Distance (Feet): 200 Feet Assistive device: Rolling walker (2 wheeled) Gait Pattern/deviations: Step-through pattern;Antalgic;Decreased stride length Gait velocity: decreased   General Gait Details: verbal cues for increasing knee flexion (hx of bil achilles tendon replacements limiting DF ability)   Stairs            Wheelchair Mobility    Modified Rankin (Stroke Patients Only)       Balance                                    Cognition Arousal/Alertness: Awake/alert Behavior During Therapy: WFL for tasks assessed/performed Overall Cognitive Status: Within Functional Limits for tasks assessed                      Exercises Total Joint Exercises Ankle Circles/Pumps: AROM;10 reps;Both Quad Sets: AROM;Both;10 reps Short Arc Quad: AROM;Both;10 reps Heel Slides:  AAROM;Both;10 reps;Seated Hip ABduction/ADduction: AROM;Both;10 reps Straight Leg Raises: AROM;Both;10 reps Goniometric ROM: L knee flexion AAROM 95* and R knee flexion AAROM 80*    General Comments        Pertinent Vitals/Pain Pain Assessment: 0-10 Pain Score: 2  Pain Location: bil knees Pain Descriptors / Indicators: Sore Pain Intervention(s): Limited activity within patient's tolerance;Monitored during session;Repositioned;RN gave pain meds during session;Ice applied    Home Living Family/patient expects to be discharged to:: Private residence Living Arrangements: Spouse/significant other           Home Equipment: None      Prior Function Level of Independence: Independent          PT Goals (current goals can now be found in the care plan section) Acute Rehab PT Goals Patient Stated Goal: home as soon as possible Progress towards PT goals: Progressing toward goals    Frequency  7X/week    PT Plan Current plan remains appropriate    Co-evaluation             End of Session   Activity Tolerance: Patient tolerated treatment well Patient left: in chair;with call bell/phone within reach;with family/visitor present     Time: [id number] PT Time Calculation (min) (ACUTE ONLY): 40 min  Charges:  $Gait Training: 8-22 mins $Therapeutic Exercise: 23-37 mins                    G Codes:  Cassidi Modesitt,KATHrine E 11/01/2015, 12:46 PM Zenovia Jarred, PT, DPT 11/01/2015 Pager: [phone]

## 2016-02-05 DIAGNOSIS — E291 Testicular hypofunction: Secondary | ICD-10-CM | POA: Diagnosis not present

## 2016-02-13 DIAGNOSIS — Z471 Aftercare following joint replacement surgery: Secondary | ICD-10-CM | POA: Diagnosis not present

## 2016-02-13 DIAGNOSIS — Z96643 Presence of artificial hip joint, bilateral: Secondary | ICD-10-CM | POA: Diagnosis not present

## 2016-02-19 DIAGNOSIS — E291 Testicular hypofunction: Secondary | ICD-10-CM | POA: Diagnosis not present

## 2016-03-04 DIAGNOSIS — E291 Testicular hypofunction: Secondary | ICD-10-CM | POA: Diagnosis not present

## 2016-03-17 DIAGNOSIS — D2361 Other benign neoplasm of skin of right upper limb, including shoulder: Secondary | ICD-10-CM | POA: Diagnosis not present

## 2016-03-17 DIAGNOSIS — L570 Actinic keratosis: Secondary | ICD-10-CM | POA: Diagnosis not present

## 2016-03-17 DIAGNOSIS — I781 Nevus, non-neoplastic: Secondary | ICD-10-CM | POA: Diagnosis not present

## 2016-03-17 DIAGNOSIS — L821 Other seborrheic keratosis: Secondary | ICD-10-CM | POA: Diagnosis not present

## 2016-03-17 DIAGNOSIS — D1800 Hemangioma unspecified site: Secondary | ICD-10-CM | POA: Diagnosis not present

## 2016-04-01 DIAGNOSIS — E291 Testicular hypofunction: Secondary | ICD-10-CM | POA: Diagnosis not present

## 2016-04-29 DIAGNOSIS — E291 Testicular hypofunction: Secondary | ICD-10-CM | POA: Diagnosis not present

## 2016-05-13 DIAGNOSIS — E291 Testicular hypofunction: Secondary | ICD-10-CM | POA: Diagnosis not present

## 2016-05-27 DIAGNOSIS — E291 Testicular hypofunction: Secondary | ICD-10-CM | POA: Diagnosis not present

## 2016-06-10 DIAGNOSIS — E291 Testicular hypofunction: Secondary | ICD-10-CM | POA: Diagnosis not present

## 2016-06-16 DIAGNOSIS — E782 Mixed hyperlipidemia: Secondary | ICD-10-CM | POA: Diagnosis not present

## 2016-06-16 DIAGNOSIS — Z79899 Other long term (current) drug therapy: Secondary | ICD-10-CM | POA: Diagnosis not present

## 2016-06-16 DIAGNOSIS — Z91038 Other insect allergy status: Secondary | ICD-10-CM | POA: Diagnosis not present

## 2016-06-16 DIAGNOSIS — E291 Testicular hypofunction: Secondary | ICD-10-CM | POA: Diagnosis not present

## 2016-06-16 DIAGNOSIS — Z131 Encounter for screening for diabetes mellitus: Secondary | ICD-10-CM | POA: Diagnosis not present

## 2016-07-08 DIAGNOSIS — N529 Male erectile dysfunction, unspecified: Secondary | ICD-10-CM | POA: Diagnosis not present

## 2016-07-23 DIAGNOSIS — E291 Testicular hypofunction: Secondary | ICD-10-CM | POA: Diagnosis not present

## 2016-07-23 DIAGNOSIS — Z23 Encounter for immunization: Secondary | ICD-10-CM | POA: Diagnosis not present

## 2016-08-05 DIAGNOSIS — E291 Testicular hypofunction: Secondary | ICD-10-CM | POA: Diagnosis not present

## 2016-08-19 DIAGNOSIS — E291 Testicular hypofunction: Secondary | ICD-10-CM | POA: Diagnosis not present

## 2016-09-02 DIAGNOSIS — E291 Testicular hypofunction: Secondary | ICD-10-CM | POA: Diagnosis not present

## 2016-09-16 DIAGNOSIS — E291 Testicular hypofunction: Secondary | ICD-10-CM | POA: Diagnosis not present

## 2016-09-30 DIAGNOSIS — E291 Testicular hypofunction: Secondary | ICD-10-CM | POA: Diagnosis not present

## 2016-10-14 DIAGNOSIS — E291 Testicular hypofunction: Secondary | ICD-10-CM | POA: Diagnosis not present

## 2016-10-28 DIAGNOSIS — E291 Testicular hypofunction: Secondary | ICD-10-CM | POA: Diagnosis not present

## 2016-11-11 DIAGNOSIS — E291 Testicular hypofunction: Secondary | ICD-10-CM | POA: Diagnosis not present

## 2016-12-08 DIAGNOSIS — L259 Unspecified contact dermatitis, unspecified cause: Secondary | ICD-10-CM | POA: Diagnosis not present

## 2016-12-22 DIAGNOSIS — E291 Testicular hypofunction: Secondary | ICD-10-CM | POA: Diagnosis not present

## 2016-12-22 DIAGNOSIS — Z79899 Other long term (current) drug therapy: Secondary | ICD-10-CM | POA: Diagnosis not present

## 2016-12-22 DIAGNOSIS — R079 Chest pain, unspecified: Secondary | ICD-10-CM | POA: Diagnosis not present

## 2016-12-23 ENCOUNTER — Telehealth: Payer: Self-pay

## 2016-12-23 NOTE — Telephone Encounter (Signed)
Sent notes to scheduling 

## 2017-01-26 ENCOUNTER — Encounter (INDEPENDENT_AMBULATORY_CARE_PROVIDER_SITE_OTHER): Payer: Self-pay

## 2017-01-26 ENCOUNTER — Encounter: Payer: Self-pay | Admitting: Cardiology

## 2017-01-26 ENCOUNTER — Ambulatory Visit (INDEPENDENT_AMBULATORY_CARE_PROVIDER_SITE_OTHER): Payer: BLUE CROSS/BLUE SHIELD | Admitting: Cardiology

## 2017-01-26 VITALS — BP 130/80 | HR 81 | Ht 73.0 in | Wt 310.8 lb

## 2017-01-26 DIAGNOSIS — E7800 Pure hypercholesterolemia, unspecified: Secondary | ICD-10-CM | POA: Diagnosis not present

## 2017-01-26 DIAGNOSIS — I209 Angina pectoris, unspecified: Secondary | ICD-10-CM

## 2017-01-26 DIAGNOSIS — Z01812 Encounter for preprocedural laboratory examination: Secondary | ICD-10-CM

## 2017-01-26 LAB — CBC
HEMATOCRIT: 48.4 % (ref 37.5–51.0)
Hemoglobin: 16.7 g/dL (ref 13.0–17.7)
MCH: 31.8 pg (ref 26.6–33.0)
MCHC: 34.5 g/dL (ref 31.5–35.7)
MCV: 92 fL (ref 79–97)
Platelets: 160 10*3/uL (ref 150–379)
RBC: 5.25 x10E6/uL (ref 4.14–5.80)
RDW: 15.2 % (ref 12.3–15.4)
WBC: 7.3 10*3/uL (ref 3.4–10.8)

## 2017-01-26 LAB — BASIC METABOLIC PANEL
BUN / CREAT RATIO: 20 (ref 9–20)
BUN: 17 mg/dL (ref 6–24)
CO2: 25 mmol/L (ref 18–29)
Calcium: 10.1 mg/dL (ref 8.7–10.2)
Chloride: 97 mmol/L (ref 96–106)
Creatinine, Ser: 0.87 mg/dL (ref 0.76–1.27)
GFR calc Af Amer: 112 mL/min/{1.73_m2} (ref >59)
GFR, EST NON AFRICAN AMERICAN: 96 mL/min/{1.73_m2} (ref >59)
GLUCOSE: 230 mg/dL — AB (ref 65–99)
POTASSIUM: 4.6 mmol/L (ref 3.5–5.2)
SODIUM: 139 mmol/L (ref 134–144)

## 2017-01-26 LAB — PROTIME-INR
INR: 1 (ref 0.8–1.2)
PROTHROMBIN TIME: 10.5 s (ref 9.1–12.0)

## 2017-01-26 NOTE — Patient Instructions (Signed)
Medication Instructions:  The current medical regimen is effective;  continue present plan and medications.  Labwork: Please have blood work today (CBC, BMP and PT/INR)  Testing/Procedures: Your physician has requested that you have a cardiac catheterization. Cardiac catheterization is used to diagnose and/or treat various heart conditions. Doctors may recommend this procedure for a number of different reasons. The most common reason is to evaluate chest pain. Chest pain can be a symptom of coronary artery disease (CAD), and cardiac catheterization can show whether plaque is narrowing or blocking your heart's arteries. This procedure is also used to evaluate the valves, as well as measure the blood flow and oxygen levels in different parts of your heart. For further information please visit [email or web address] Please follow instruction sheet, as given.  Follow-Up: Follow up approximately 2 weeks after your heart cath.  Thank you for choosing [facility]!!       [facility] [address], [address] [place] [address] Dept: [phone] Loc: [phone]  Max SaneJeffrey O Bayliss  01/26/2017  You are scheduled for a cardiac cath on Thursday, 01/28/17 with Dr. Katrinka BlazingSmith.  1. [facility] at [facility]: [address] [place], [place] [address] at 11:30 am (two hours before your procedure to ensure your preparation). Free valet parking service is available.   Special note: Every effort is made to have your procedure done on time. Please understand that emergencies sometimes delay scheduled procedures.  2. Diet: Nothing to eat or drink after midnight.  3. Labs: Please have blood work today  4. Medication instructions in preparation for your procedure:  On the morning of your procedure, take your ASA.  You may use sips of water.  5. Plan for one  night stay--bring personal belongings. 6. Bring a current list of your medications and current insurance cards. 7. You MUST have a responsible person to drive you home. 8. Someone MUST be with you the first 24 hours after you arrive home or your discharge will be delayed. 9. Please wear clothes that are easy to get on and off and wear slip-on shoes.  Thank you for allowing us to care for you!   -- [facility]

## 2017-01-26 NOTE — Progress Notes (Signed)
Cardiology Office Note    Date:  01/26/2017   ID:  [name], DOB 04/15/1961, MRN [id number]  PCP:  Duane Lope, MD  Cardiologist:   Donato Schultz, MD   Chief Complaint  Patient presents with  . New Patient (Initial Visit)    chest pain    History of Present Illness:  Russell [name] is a 56 y.o. male here for evaluation of chest pain at the request of Dr. Duane Lope. Father had 6 heart attacks, died at age 35. Mother with MI.  He has been experiencing chest discomfort when walking uphill over the past month, moderate in severity. Duration 15 minutes, associated shortness of breath, took aspirin and this resolved. Relieved with rest. 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 Had a pain like never before, ripping out of his chest. Could not get breath. Slow breaths stopped. Stipped down to T shirt, diaphoretic.   Takes baby ASA when he thinks about it.   Has some left arm pain at times, sometimes pain in chest at times.   Never smoked. Nondiabetic. Morbid obesity. He has been receiving testosterone.  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   Chol elevated. Lipitor about killed him he says.     Past Medical History:  Diagnosis Date  . Abnormality of gait and mobility   . Arthritis   . History of kidney stones   . Indigestion   . Pneumonia    history of times one - 2010  . PONV (postoperative nausea and vomiting)   . Wears glasses     Past Surgical History:  Procedure Laterality Date  . ACL bilat knee     repair   . bilat achilles replacement     . BILATERAL KNEE ARTHROSCOPY    . CERVICAL SPINE SURGERY     C6 secondary to trauma   . HERNIA REPAIR     inguinal - left   . MEDIAL COLLATERAL LIGAMENT REPAIR, KNEE     bilat   . PARTIAL KNEE ARTHROPLASTY Bilateral 10/31/2015   Procedure: UNICOMPARTMENTAL KNEE BILATERAL MEDIALLY;  Surgeon: Durene Romans, MD;  Location: [facility];  Service: Orthopedics;  Laterality: Bilateral;  . ROTATOR CUFF REPAIR     right  shoulder     Current Medications: Outpatient Medications Prior to Visit  Medication Sig Dispense Refill  . celecoxib (CELEBREX) 200 MG capsule Take 1 capsule (200 mg total) by mouth every 12 (twelve) hours. (Patient not taking: Reported on 01/26/2017) 60 capsule 0  . docusate sodium (COLACE) 100 MG capsule Take 1 capsule (100 mg total) by mouth 2 (two) times daily. (Patient not taking: Reported on 01/26/2017) 10 capsule 0  . ferrous sulfate 325 (65 FE) MG tablet Take 1 tablet (325 mg total) by mouth 3 (three) times daily after meals. (Patient not taking: Reported on 01/26/2017)  3  . methocarbamol (ROBAXIN) 500 MG tablet Take 1 tablet (500 mg total) by mouth every 6 (six) hours as needed for muscle spasms. (Patient not taking: Reported on 01/26/2017) 40 tablet 0  . oxyCODONE (OXY IR/ROXICODONE) 5 MG immediate release tablet Take 1-2 tablets (5-10 mg total) by mouth every 4 (four) hours as needed for breakthrough pain. (Patient not taking: Reported on 01/26/2017) 100 tablet 0  . polyethylene glycol (MIRALAX / GLYCOLAX) packet Take 17 g by mouth 2 (two) times daily. (Patient not taking: Reported on 01/26/2017) 14 each 0  . rivaroxaban (XARELTO) 10 MG TABS tablet Take 1 tablet (10 mg  total) by mouth daily with breakfast. 14 tablet 0  . testosterone cypionate (DEPOTESTOSTERONE CYPIONATE) 200 MG/ML injection Inject 100 mg into the muscle every 14 (fourteen) days.     No facility-administered medications prior to visit.      Allergies:   Patient has no known allergies.   Social History   Social History  . Marital status: Married    Spouse name: N/A  . Number of children: N/A  . Years of education: N/A   Social History Main Topics  . Smoking status: Never Smoker  . Smokeless tobacco: Never Used  . Alcohol use Yes     Comment: rarely   . Drug use: No  . Sexual activity: Not Asked   Other Topics Concern  . None   Social History Narrative  . None     Family History:  The patient's family  history includes Heart attack in his father.   ROS:   Please see the history of present illness.    ROS All other systems reviewed and are negative.   PHYSICAL EXAM:   VS:  BP 130/80   Pulse 81   Ht 6\' 1"  (1.854 m)   Wt (!) 310 lb 12.8 oz (141 kg)   BMI 41.01 kg/m    GEN: Well nourished, well developed, in no acute distress  HEENT: normal  Neck: no JVD, carotid bruits, or masses Cardiac: RRR; no murmurs, rubs, or gallops,no edema  Respiratory:  clear to auscultation bilaterally, normal work of breathing GI: soft, nontender, nondistended, + BS, obese MS: no deformity or atrophy  Skin: warm and dry, no rash Neuro:  Alert and Oriented x 3, Strength and sensation are intact Psych: euthymic mood, full affect  Wt Readings from Last 3 Encounters:  01/26/17 (!) 310 lb 12.8 oz (141 kg)  10/31/15 (!) 309 lb (140.2 kg)  10/23/15 (!) 309 lb 4 oz (140.3 kg)      Studies/Labs Reviewed:   EKG:  EKG ordered today 01/26/17 normal sinus rhythm 81, borderline LVH with nonspecific ST-T wave changes, subtle T-wave inversion in aVF, T-wave inversion noted in 3. Personally viewed-prior 12/22/16-sinus rhythm with no other abnormalities other than borderline low voltage. Personally viewed.  Recent Labs: No results found for requested labs within last 8760 hours.   White count 6.5, hemoglobin 17, platelets 171  Lipid Panel No results found for: CHOL, TRIG, HDL, CHOLHDL, VLDL, LDLCALC, LDLDIRECT  Additional studies/ records that were reviewed today include:  Prior office notes reviewed, EKG reviewed, lab work reviewed    ASSESSMENT:    1. Angina pectoris (HCC)   2. Pre-operative laboratory examination   3. Morbid obesity (HCC)   4. Pure hypercholesterolemia      PLAN:  In order of problems listed above:  Angina  - Chest discomfort when walking uphill associated with shortness of breath lasting 15 minutes duration relieved with rest. Fairly significant symptom. Occasional chest  pressure and left arm pain.  - Strong family history of MI with his father dying in his 1660s after multiple heart attacks.  - We'll proceed with cardiac catheterization-risks and benefits of stroke, heart attack, death, renal impairment explained. Pretest probability elevated.  - Encouraged him to take his aspirin 81 mg daily. We discussed adding Toprol but he would like to wait.  Hyperlipidemia  - Encourage use of statin.  - Perhaps we could try Crestor in the future, potentially better tolerated than atorvastatin.  Morbid obesity  - Continue to encourage weight loss.  Medication Adjustments/Labs and Tests Ordered: Current medicines are reviewed at length with the patient today.  Concerns regarding medicines are outlined above.  Medication changes, Labs and Tests ordered today are listed in the Patient Instructions below. Patient Instructions  Medication Instructions:  The current medical regimen is effective;  continue present plan and medications.  Labwork: Please have blood work today (CBC, BMP and PT/INR)  Testing/Procedures: Your physician has requested that you have a cardiac catheterization. Cardiac catheterization is used to diagnose and/or treat various heart conditions. Doctors may recommend this procedure for a number of different reasons. The most common reason is to evaluate chest pain. Chest pain can be a symptom of coronary artery disease (CAD), and cardiac catheterization can show whether plaque is narrowing or blocking your heart's arteries. This procedure is also used to evaluate the valves, as well as measure the blood flow and oxygen levels in different parts of your heart. For further information please visit [email or web address] Please follow instruction sheet, as given.  Follow-Up: Follow up approximately 2 weeks after your heart cath.  Thank you for choosing [facility]!!       [facility]  [facility] ST OFFICE [address] [place] [address] Dept: [phone] Loc: [phone]  KAYLA WEEKES  01/26/2017  You are scheduled for a cardiac cath on Thursday, 01/28/17 with Dr. Katrinka Blazing.  1. Please arrive at the [facility] (Main Entrance A) at [facility]: [address] [place], [place] [address] at 11:30 am (two hours before your procedure to ensure your preparation). Free valet parking service is available.   Special note: Every effort is made to have your procedure done on time. Please understand that emergencies sometimes delay scheduled procedures.  2. Diet: Nothing to eat or drink after midnight.  3. Labs: Please have blood work today  4. Medication instructions in preparation for your procedure:  On the morning of your procedure, take your ASA.  You may use sips of water.  5. Plan for one night stay--bring personal belongings. 6. Bring a current list of your medications and current insurance cards. 7. You MUST have a responsible person to drive you home. 8. Someone MUST be with you the first 24 hours after you arrive home or your discharge will be delayed. 9. Please wear clothes that are easy to get on and off and wear slip-on shoes.  Thank you for allowing [place] to care for you!   -- [facility] Invasive Cardiovascular services     Signed, Donato Schultz, MD  01/26/2017 12:30 PM    [facility] [address] [place], [place], [place]  [address] Phone: [phone]; Fax: [phone]

## 2017-01-28 ENCOUNTER — Ambulatory Visit (HOSPITAL_COMMUNITY)
Admission: RE | Admit: 2017-01-28 | Discharge: 2017-01-28 | Disposition: A | Discharge status: Home / Self Care | Payer: BLUE CROSS/BLUE SHIELD | Source: Ambulatory Visit | Attending: Interventional Cardiology | Admitting: Interventional Cardiology

## 2017-01-28 ENCOUNTER — Encounter (HOSPITAL_COMMUNITY)
Admission: RE | Disposition: A | Discharge status: Home / Self Care | Payer: Self-pay | Source: Ambulatory Visit | Attending: Interventional Cardiology

## 2017-01-28 DIAGNOSIS — M1990 Unspecified osteoarthritis, unspecified site: Secondary | ICD-10-CM | POA: Insufficient documentation

## 2017-01-28 DIAGNOSIS — I209 Angina pectoris, unspecified: Secondary | ICD-10-CM | POA: Diagnosis not present

## 2017-01-28 DIAGNOSIS — Z7901 Long term (current) use of anticoagulants: Secondary | ICD-10-CM | POA: Diagnosis not present

## 2017-01-28 DIAGNOSIS — Z6841 Body Mass Index (BMI) 40.0 and over, adult: Secondary | ICD-10-CM | POA: Insufficient documentation

## 2017-01-28 DIAGNOSIS — E7800 Pure hypercholesterolemia, unspecified: Secondary | ICD-10-CM | POA: Diagnosis not present

## 2017-01-28 DIAGNOSIS — Z8249 Family history of ischemic heart disease and other diseases of the circulatory system: Secondary | ICD-10-CM | POA: Diagnosis not present

## 2017-01-28 HISTORY — DX: Angina pectoris, unspecified: I20.9

## 2017-01-28 HISTORY — PX: LEFT HEART CATH AND CORONARY ANGIOGRAPHY: CATH118249

## 2017-01-28 SURGERY — LEFT HEART CATH AND CORONARY ANGIOGRAPHY
Anesthesia: LOCAL

## 2017-01-28 MED ORDER — HEPARIN (PORCINE) IN NACL 2-0.9 UNIT/ML-% IJ SOLN
INTRAMUSCULAR | Status: AC
  Filled 2017-01-28: qty 1000

## 2017-01-28 MED ORDER — LIDOCAINE HCL (PF) 1 % IJ SOLN
INTRAMUSCULAR | Status: DC | PRN
  Administered 2017-01-28: 2 mL via INTRADERMAL

## 2017-01-28 MED ORDER — MIDAZOLAM HCL 2 MG/2ML IJ SOLN
INTRAMUSCULAR | Status: AC
  Filled 2017-01-28: qty 2

## 2017-01-28 MED ORDER — IOPAMIDOL (ISOVUE-370) INJECTION 76%
INTRAVENOUS | Status: DC | PRN
  Administered 2017-01-28: 90 mL via INTRA_ARTERIAL

## 2017-01-28 MED ORDER — SODIUM CHLORIDE 0.9 % WEIGHT BASED INFUSION
3.0000 mL/kg/h | INTRAVENOUS | Status: DC
  Administered 2017-01-28: 3 mL/kg/h via INTRAVENOUS

## 2017-01-28 MED ORDER — MIDAZOLAM HCL 2 MG/2ML IJ SOLN
INTRAMUSCULAR | Status: DC | PRN
  Administered 2017-01-28 (×2): 1 mg via INTRAVENOUS

## 2017-01-28 MED ORDER — SODIUM CHLORIDE 0.9% FLUSH: 3.0000 mL | INTRAVENOUS | Status: DC | PRN

## 2017-01-28 MED ORDER — LIDOCAINE HCL (PF) 1 % IJ SOLN
INTRAMUSCULAR | Status: AC
  Filled 2017-01-28: qty 30

## 2017-01-28 MED ORDER — FENTANYL CITRATE (PF) 100 MCG/2ML IJ SOLN
INTRAMUSCULAR | Status: AC
  Filled 2017-01-28: qty 2

## 2017-01-28 MED ORDER — HEPARIN SODIUM (PORCINE) 1000 UNIT/ML IJ SOLN
INTRAMUSCULAR | Status: AC
  Filled 2017-01-28: qty 1

## 2017-01-28 MED ORDER — ASPIRIN 81 MG PO CHEW: 81.0000 mg | CHEWABLE_TABLET | ORAL | Status: DC

## 2017-01-28 MED ORDER — IOPAMIDOL (ISOVUE-370) INJECTION 76%
INTRAVENOUS | Status: AC
  Filled 2017-01-28: qty 100

## 2017-01-28 MED ORDER — NITROGLYCERIN 1 MG/10 ML FOR IR/CATH LAB
INTRA_ARTERIAL | Status: DC | PRN
  Administered 2017-01-28: 200 ug via INTRACORONARY

## 2017-01-28 MED ORDER — FENTANYL CITRATE (PF) 100 MCG/2ML IJ SOLN
INTRAMUSCULAR | Status: DC | PRN
  Administered 2017-01-28 (×2): 50 ug via INTRAVENOUS

## 2017-01-28 MED ORDER — VERAPAMIL HCL 2.5 MG/ML IV SOLN
INTRAVENOUS | Status: AC
  Filled 2017-01-28: qty 2

## 2017-01-28 MED ORDER — VERAPAMIL HCL 2.5 MG/ML IV SOLN
INTRAVENOUS | Status: DC | PRN
  Administered 2017-01-28: 14:00:00 via INTRA_ARTERIAL

## 2017-01-28 MED ORDER — SODIUM CHLORIDE 0.9 % IV SOLN: 250.0000 mL | INTRAVENOUS | Status: DC | PRN

## 2017-01-28 MED ORDER — NITROGLYCERIN 1 MG/10 ML FOR IR/CATH LAB
INTRA_ARTERIAL | Status: AC
  Filled 2017-01-28: qty 10

## 2017-01-28 MED ORDER — SODIUM CHLORIDE 0.9 % WEIGHT BASED INFUSION: 1.0000 mL/kg/h | INTRAVENOUS | Status: DC

## 2017-01-28 MED ORDER — HEPARIN SODIUM (PORCINE) 1000 UNIT/ML IJ SOLN
INTRAMUSCULAR | Status: DC | PRN
  Administered 2017-01-28: 6000 [IU] via INTRAVENOUS

## 2017-01-28 MED ORDER — HEPARIN (PORCINE) IN NACL 2-0.9 UNIT/ML-% IJ SOLN
INTRAMUSCULAR | Status: DC | PRN
  Administered 2017-01-28: 1000 mL via INTRA_ARTERIAL

## 2017-01-28 MED ORDER — SODIUM CHLORIDE 0.9% FLUSH: 3.0000 mL | Freq: Two times a day (BID) | INTRAVENOUS | Status: DC

## 2017-01-28 SURGICAL SUPPLY — 12 items
CATH INFINITI 5 FR JL3.5 (CATHETERS) ×2 IMPLANT
CATH INFINITI JR4 5F (CATHETERS) ×2 IMPLANT
COVER PRB 48X5XTLSCP FOLD TPE (BAG) ×2 IMPLANT
COVER PROBE 5X48 (BAG) ×4
DEVICE RAD COMP TR BAND LRG (VASCULAR PRODUCTS) ×2 IMPLANT
GLIDESHEATH SLEND A-KIT 6F 22G (SHEATH) ×2 IMPLANT
GUIDEWIRE INQWIRE 1.5J.035X260 (WIRE) ×1 IMPLANT
INQWIRE 1.5J .035X260CM (WIRE) ×2
KIT HEART LEFT (KITS) ×2 IMPLANT
PACK CARDIAC CATHETERIZATION (CUSTOM PROCEDURE TRAY) ×2 IMPLANT
TRANSDUCER W/STOPCOCK (MISCELLANEOUS) ×2 IMPLANT
TUBING CIL FLEX 10 FLL-RA (TUBING) ×2 IMPLANT

## 2017-01-28 NOTE — H&P (View-Only) (Signed)
 Cardiology Office Note    Date:  01/26/2017   ID:  Russell [name], DOB 04/02/1961, MRN [id number]  PCP:  Alan Ross, MD  Cardiologist:   Mark Skains, MD   Chief Complaint  Patient presents with  . New Patient (Initial Visit)    chest pain    History of Present Illness:  Russell [name] is a 56 y.o. male here for evaluation of chest pain at the request of Dr. Alan Ross. Father had 6 heart attacks, died at age 66. Mother with MI.  He has been experiencing chest discomfort when walking uphill over the past month, moderate in severity. Duration 15 minutes, associated shortness of breath, took aspirin and this resolved. Relieved with rest. He was on the [employer], photographer. Had a pain like never before, ripping out of his chest. Could not get breath. Slow breaths stopped. Stipped down to T shirt, diaphoretic.   Takes baby ASA when he thinks about it.   Has some left arm pain at times, sometimes pain in chest at times.   Never smoked. Nondiabetic. Morbid obesity. He has been receiving testosterone.  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   Chol elevated. Lipitor about killed him he says.     Past Medical History:  Diagnosis Date  . Abnormality of gait and mobility   . Arthritis   . History of kidney stones   . Indigestion   . Pneumonia    history of times one - 2010  . PONV (postoperative nausea and vomiting)   . Wears glasses     Past Surgical History:  Procedure Laterality Date  . ACL bilat knee     repair   . bilat achilles replacement     . BILATERAL KNEE ARTHROSCOPY    . CERVICAL SPINE SURGERY     C6 secondary to trauma   . HERNIA REPAIR     inguinal - left   . MEDIAL COLLATERAL LIGAMENT REPAIR, KNEE     bilat   . PARTIAL KNEE ARTHROPLASTY Bilateral 10/31/2015   Procedure: UNICOMPARTMENTAL KNEE BILATERAL MEDIALLY;  Surgeon: Matthew Olin, MD;  Location: [facility];  Service: Orthopedics;  Laterality: Bilateral;  . ROTATOR CUFF REPAIR     right  shoulder     Current Medications: Outpatient Medications Prior to Visit  Medication Sig Dispense Refill  . celecoxib (CELEBREX) 200 MG capsule Take 1 capsule (200 mg total) by mouth every 12 (twelve) hours. (Patient not taking: Reported on 01/26/2017) 60 capsule 0  . docusate sodium (COLACE) 100 MG capsule Take 1 capsule (100 mg total) by mouth 2 (two) times daily. (Patient not taking: Reported on 01/26/2017) 10 capsule 0  . ferrous sulfate 325 (65 FE) MG tablet Take 1 tablet (325 mg total) by mouth 3 (three) times daily after meals. (Patient not taking: Reported on 01/26/2017)  3  . methocarbamol (ROBAXIN) 500 MG tablet Take 1 tablet (500 mg total) by mouth every 6 (six) hours as needed for muscle spasms. (Patient not taking: Reported on 01/26/2017) 40 tablet 0  . oxyCODONE (OXY IR/ROXICODONE) 5 MG immediate release tablet Take 1-2 tablets (5-10 mg total) by mouth every 4 (four) hours as needed for breakthrough pain. (Patient not taking: Reported on 01/26/2017) 100 tablet 0  . polyethylene glycol (MIRALAX / GLYCOLAX) packet Take 17 g by mouth 2 (two) times daily. (Patient not taking: Reported on 01/26/2017) 14 each 0  . rivaroxaban (XARELTO) 10 MG TABS tablet Take 1 tablet (10 mg   total) by mouth daily with breakfast. 14 tablet 0  . testosterone cypionate (DEPOTESTOSTERONE CYPIONATE) 200 MG/ML injection Inject 100 mg into the muscle every 14 (fourteen) days.     No facility-administered medications prior to visit.      Allergies:   Patient has no known allergies.   Social History   Social History  . Marital status: Married    Spouse name: N/A  . Number of children: N/A  . Years of education: N/A   Social History Main Topics  . Smoking status: Never Smoker  . Smokeless tobacco: Never Used  . Alcohol use Yes     Comment: rarely   . Drug use: No  . Sexual activity: Not Asked   Other Topics Concern  . None   Social History Narrative  . None     Family History:  The patient's family  history includes Heart attack in his father.   ROS:   Please see the history of present illness.    ROS All other systems reviewed and are negative.   PHYSICAL EXAM:   VS:  BP 130/80   Pulse 81   Ht 6' 1" (1.854 m)   Wt (!) 310 lb 12.8 oz (141 kg)   BMI 41.01 kg/m    GEN: Well nourished, well developed, in no acute distress  HEENT: normal  Neck: no JVD, carotid bruits, or masses Cardiac: RRR; no murmurs, rubs, or gallops,no edema  Respiratory:  clear to auscultation bilaterally, normal work of breathing GI: soft, nontender, nondistended, + BS, obese MS: no deformity or atrophy  Skin: warm and dry, no rash Neuro:  Alert and Oriented x 3, Strength and sensation are intact Psych: euthymic mood, full affect  Wt Readings from Last 3 Encounters:  01/26/17 (!) 310 lb 12.8 oz (141 kg)  10/31/15 (!) 309 lb (140.2 kg)  10/23/15 (!) 309 lb 4 oz (140.3 kg)      Studies/Labs Reviewed:   EKG:  EKG ordered today 01/26/17 normal sinus rhythm 81, borderline LVH with nonspecific ST-T wave changes, subtle T-wave inversion in aVF, T-wave inversion noted in 3. Personally viewed-prior 12/22/16-sinus rhythm with no other abnormalities other than borderline low voltage. Personally viewed.  Recent Labs: No results found for requested labs within last 8760 hours.   White count 6.5, hemoglobin 17, platelets 171  Lipid Panel No results found for: CHOL, TRIG, HDL, CHOLHDL, VLDL, LDLCALC, LDLDIRECT  Additional studies/ records that were reviewed today include:  Prior office notes reviewed, EKG reviewed, lab work reviewed    ASSESSMENT:    1. Angina pectoris (HCC)   2. Pre-operative laboratory examination   3. Morbid obesity (HCC)   4. Pure hypercholesterolemia      PLAN:  In order of problems listed above:  Angina  - Chest discomfort when walking uphill associated with shortness of breath lasting 15 minutes duration relieved with rest. Fairly significant symptom. Occasional chest  pressure and left arm pain.  - Strong family history of MI with his father dying in his 60s after multiple heart attacks.  - We'll proceed with cardiac catheterization-risks and benefits of stroke, heart attack, death, renal impairment explained. Pretest probability elevated.  - Encouraged him to take his aspirin 81 mg daily. We discussed adding Toprol but he would like to wait.  Hyperlipidemia  - Encourage use of statin.  - Perhaps we could try Crestor in the future, potentially better tolerated than atorvastatin.  Morbid obesity  - Continue to encourage weight loss.      Medication Adjustments/Labs and Tests Ordered: Current medicines are reviewed at length with the patient today.  Concerns regarding medicines are outlined above.  Medication changes, Labs and Tests ordered today are listed in the Patient Instructions below. Patient Instructions  Medication Instructions:  The current medical regimen is effective;  continue present plan and medications.  Labwork: Please have blood work today (CBC, BMP and PT/INR)  Testing/Procedures: Your physician has requested that you have a cardiac catheterization. Cardiac catheterization is used to diagnose and/or treat various heart conditions. Doctors may recommend this procedure for a number of different reasons. The most common reason is to evaluate chest pain. Chest pain can be a symptom of coronary artery disease (CAD), and cardiac catheterization can show whether plaque is narrowing or blocking your heart's arteries. This procedure is also used to evaluate the valves, as well as measure the blood flow and oxygen levels in different parts of your heart. For further information please visit [email or web address]. Please follow instruction sheet, as given.  Follow-Up: Follow up approximately 2 weeks after your heart cath.  Thank you for choosing [facility]!!       [facility]  [facility] [address] OFF[facility] [address], [address] [place] [address] Dept: [phone] Loc: [phone]  Russell [name]  01/26/2017  You are scheduled for a cardiac cath on Thursday, 01/28/17 with Dr. Smith.  1. Please arrive at the [facility] (Main Entrance A) at [facility]: [address] [place], [place] [address] at 11:30 am (two hours before your procedure to ensure your preparation). Free valet parking service is available.   Special note: Every effort is made to have your procedure done on time. Please understand that emergencies sometimes delay scheduled procedures.  2. Diet: Nothing to eat or drink after midnight.  3. Labs: Please have blood work today  4. Medication instructions in preparation for your procedure:  On the morning of your procedure, take your ASA.  You may use sips of water.  5. Plan for one night stay--bring personal belongings. 6. Bring a current list of your medications and current insurance cards. 7. You MUST have a responsible person to drive you home. 8. Someone MUST be with you the first 24 hours after you arrive home or your discharge will be delayed. 9. Please wear clothes that are easy to get on and off and wear slip-on shoes.  Thank you for allowing us to care for you!   -- [facility] Invasive Cardiovascular services     Signed, Mark Skains, MD  01/26/2017 12:30 PM     [facility] [address], [place], [place]  [address] Phone: [phone]; Fax: [phone]  

## 2017-01-28 NOTE — Discharge Instructions (Signed)
Radial Site Care °Refer to this sheet in the next few weeks. These instructions provide you with information about caring for yourself after your procedure. Your health care provider may also give you more specific instructions. Your treatment has been planned according to current medical practices, but problems sometimes occur. Call your health care provider if you have any problems or questions after your procedure. °What can I expect after the procedure? °After your procedure, it is typical to have the following: °· Bruising at the radial site that usually fades within 1-2 weeks. °· Blood collecting in the tissue (hematoma) that may be painful to the touch. It should usually decrease in size and tenderness within 1-2 weeks. °Follow these instructions at home: °· Take medicines only as directed by your health care provider. °· You may shower 24-48 hours after the procedure or as directed by your health care provider. Remove the bandage (dressing) and gently wash the site with plain soap and water. Pat the area dry with a clean towel. Do not rub the site, because this may cause bleeding. °· Do not take baths, swim, or use a hot tub until your health care provider approves. °· Check your insertion site every day for redness, swelling, or drainage. °· Do not apply powder or lotion to the site. °· Do not flex or bend the affected arm for 24 hours or as directed by your health care provider. °· Do not push or pull heavy objects with the affected arm for 24 hours or as directed by your health care provider. °· Do not lift over 10 lb (4.5 kg) for 5 days after your procedure or as directed by your health care provider. °· Ask your health care provider when it is okay to: °¨ Return to work or school. °¨ Resume usual physical activities or sports. °¨ Resume sexual activity. °· Do not drive home if you are discharged the same day as the procedure. Have someone else drive you. °· You may drive 24 hours after the procedure  unless otherwise instructed by your health care provider. °· Do not operate machinery or power tools for 24 hours after the procedure. °· If your procedure was done as an outpatient procedure, which means that you went home the same day as your procedure, a responsible adult should be with you for the first 24 hours after you arrive home. °· Keep all follow-up visits as directed by your health care provider. This is important. °Contact a health care provider if: °· You have a fever. °· You have chills. °· You have increased bleeding from the radial site. Hold pressure on the site. °Get help right away if: °· You have unusual pain at the radial site. °· You have redness, warmth, or swelling at the radial site. °· You have drainage (other than a small amount of blood on the dressing) from the radial site. °· The radial site is bleeding, and the bleeding does not stop after 30 minutes of holding steady pressure on the site. °· Your arm or hand becomes pale, cool, tingly, or numb. °This information is not intended to replace advice given to you by your health care provider. Make sure you discuss any questions you have with your health care provider. °Document Released: 11/21/2010 Document Revised: 03/26/2016 Document Reviewed: 05/07/2014 °Elsevier Interactive Patient Education © 2017 [employer]vier Inc. ° °

## 2017-01-28 NOTE — H&P (Signed)
Multiple risk factors. Exertional angina. Cath as diagnostic tool.  No meds.

## 2017-01-28 NOTE — Interval H&P Note (Signed)
History and Physical Interval Note:  01/28/2017 1:30 PM Cath Lab Visit (complete for each Cath Lab visit)  Clinical Evaluation Leading to the Procedure:   ACS: No.  Non-ACS:    Anginal Classification: CCS III  Anti-ischemic medical therapy: No Therapy  Non-Invasive Test Results: No non-invasive testing performed  Prior CABG: No previous CABG        Russell [name]  has presented today for surgery, with the diagnosis of angina  The various methods of treatment have been discussed with the patient and family. After consideration of risks, benefits and other options for treatment, the patient has consented to  Procedure(s): Left Heart Cath and Coronary Angiography (N/A) as a surgical intervention .  The patient's history has been reviewed, patient examined, no change in status, stable for surgery.  I have reviewed the patient's chart and labs.  Questions were answered to the patient's satisfaction.     Lyn RecordsHenry W Smith III

## 2017-01-29 ENCOUNTER — Encounter (HOSPITAL_COMMUNITY): Payer: Self-pay | Admitting: Interventional Cardiology

## 2017-02-23 DIAGNOSIS — R7309 Other abnormal glucose: Secondary | ICD-10-CM | POA: Diagnosis not present

## 2017-03-26 DIAGNOSIS — H524 Presbyopia: Secondary | ICD-10-CM | POA: Diagnosis not present

## 2017-03-26 DIAGNOSIS — H52223 Regular astigmatism, bilateral: Secondary | ICD-10-CM | POA: Diagnosis not present

## 2017-03-26 DIAGNOSIS — H5202 Hypermetropia, left eye: Secondary | ICD-10-CM | POA: Diagnosis not present

## 2017-08-13 DIAGNOSIS — T1581XA Foreign body in other and multiple parts of external eye, right eye, initial encounter: Secondary | ICD-10-CM | POA: Diagnosis not present

## 2017-09-28 DIAGNOSIS — J0100 Acute maxillary sinusitis, unspecified: Secondary | ICD-10-CM | POA: Diagnosis not present

## 2017-09-28 DIAGNOSIS — J309 Allergic rhinitis, unspecified: Secondary | ICD-10-CM | POA: Diagnosis not present

## 2017-11-22 DIAGNOSIS — E291 Testicular hypofunction: Secondary | ICD-10-CM | POA: Diagnosis not present

## 2017-11-22 DIAGNOSIS — E119 Type 2 diabetes mellitus without complications: Secondary | ICD-10-CM | POA: Diagnosis not present

## 2017-11-22 DIAGNOSIS — R350 Frequency of micturition: Secondary | ICD-10-CM | POA: Diagnosis not present

## 2017-12-09 ENCOUNTER — Encounter: Payer: Self-pay | Admitting: Registered"

## 2017-12-09 ENCOUNTER — Encounter: Payer: BLUE CROSS/BLUE SHIELD | Attending: Family Medicine | Admitting: Registered"

## 2017-12-09 DIAGNOSIS — E119 Type 2 diabetes mellitus without complications: Secondary | ICD-10-CM

## 2017-12-09 DIAGNOSIS — Z713 Dietary counseling and surveillance: Secondary | ICD-10-CM | POA: Diagnosis not present

## 2017-12-09 HISTORY — DX: Type 2 diabetes mellitus without complications: E11.9

## 2017-12-09 NOTE — Progress Notes (Signed)
Diabetes Self-Management Education  Visit Type: First/Initial  Appt. Start Time: 0905 Appt. End Time: 1035  12/09/2017  Mr. Russell [name], identified by name and date of birth, is a 57 y.o. male with a diagnosis of Diabetes: Type 2.   ASSESSMENT Patient states he has drastically changed his diet since his last doctors appointment 3 weeks ago after getting an A1c of >12%. Patient states he is only drinking water, but he was drinking a lot of regular soda & cranberry-OJ without realizing it was a problem. Patient has also cut back drastically on carb intake.  Patient states high level of concern due to blurred vision. Patient reports that his doctor said it will get better as his blood sugar normalizes, but patient states it seems to be getting worse. Patient states it has been a long time since he has had an eye exam.   Patient states when he first started checking his BG it was running over 200 and into the 300's, but now is always below 200 (except 1 occasion). Patient reports confusion about why his fasting BGs are higher than during the day after he eats. Patient states he has tried an evening snack, something small just an ounce or 2 of meat  to avoid heart burn, but it hasn't affected his morning high BG. Patient states he takes Glimepiride in the morning and metformin at night.    Patient states he has been taking 3 Tums each night for a long time and this along with not eating late at night has been controlling heart burn.  Patient states he averages 6 hours sleep at night and takes 25 min nap during the day and feels he could use more sleep. 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  Diabetes Self-Management Education - 12/09/17 8119      Visit Information   Visit Type  First/Initial      Initial Visit   Diabetes Type  Type 2    Are you currently following a meal plan?  No    Are you taking your medications as prescribed?  Yes    Date Diagnosed  3 weeks ago      Health Coping   How would you rate your overall health?  Good      Psychosocial Assessment   Patient Belief/Attitude about Diabetes  Motivated to manage diabetes    How often do you need to have someone help you when you read instructions, pamphlets, or other written materials from your doctor or pharmacy?  1 - Never    What is the last grade level you completed in school?  college      Complications   Last HgB A1C per patient/outside source  12.9 % per referral labs    How often do you check your blood sugar?  1-2 times/day    Fasting Blood glucose range (mg/dL)  [phone] avg    Number of hypoglycemic episodes per month  0    Number of hyperglycemic episodes per week  0    Have you had a dilated eye exam in the past 12 months?  No    Have you had a dental exam in the past 12 months?  Yes    Are you checking your feet?  Yes    How many days per week are you checking your feet?  1      Dietary Intake   Breakfast  eggs, candian bacon, water     Snack (morning)  none OR Malaw[place]urkey OR nuts or seeds OR handful berries    Lunch  stir fry meat & veggies 80% OR burger (no bun) lower sugar ketchup, salad     Snack (afternoon)  almonds, pumpkin seeds or meat or cheese    Dinner  same as lunch    Snack (evening)  small amount [place]urkey    Beverage(s)  water, occassional beer      Exercise   Exercise Type  Light (walking / raking leaves)    How many days per week to you exercise?  2    How many minutes per day do you exercise?  30    Total minutes per week of exercise  60      Patient Education   Previous Diabetes Education  No    Disease state   Definition of diabetes, type 1 and 2, and the diagnosis of diabetes;Factors that contribute to the development of  diabetes    Nutrition management   Role of diet in the treatment of diabetes and the relationship between the three main macronutrients and blood glucose level    Physical activity and exercise   Role of exercise on diabetes management, blood pressure control and cardiac health.    Monitoring  Identified appropriate SMBG and/or A1C goals.    Acute complications  Taught treatment of hypoglycemia - the 15 rule.    Psychosocial adjustment  Role of stress on diabetes sleep      Individualized Goals (developed by patient)   Nutrition  General guidelines for healthy choices and portions discussed    Physical Activity  Exercise 3-5 times per week    Monitoring   test my blood glucose as discussed      Outcomes   Expected Outcomes  Demonstrated interest in learning. Expect positive outcomes    Future DMSE  PRN    Program Status  Not Completed     Individualized Plan for Diabetes Self-Management Training:   Learning Objective:  Patient will have a greater understanding of diabetes self-management. Patient education plan is to attend individual and/or group sessions per assessed needs and concerns.   Patient Instructions  Plan:  Aim for 3 balanced meals per day, with not to heavy of a supper meal and not too late For the morning high blood sugar you can experiment a little more with the bedtime snack. If number are still high, talk to your doctor about switching the timing of metformin and glimepiride. Include protein with your meals and snacks Consider increasing your activity level daily as tolerated, aim for 3-5 times per week of increased heart rate. Consider checking blood sugar at alternate times per day, fasting and 2 hours after a meal. Continue taking medication as directed by MD Adequate and restful sleep is important for your health and controlling blood sugar  Expected Outcomes:  Demonstrated interest in learning. Expect positive outcomes  Education material provided: Living Well  with Diabetes, A1C conversion sheet and My Plate, sleep hygiene, Handling Morning High BG  If problems or questions, patient to contact team via:  Phone  Future DSME appointment: PRN

## 2017-12-09 NOTE — Patient Instructions (Signed)
Plan:  Aim for 3 balanced meals per day, with not to heavy of a supper meal and not too late For the morning high blood sugar you can experiment a little more with the bedtime snack. If number are still high, talk to your doctor about switching the timing of metformin and glimepiride. Include protein with your meals and snacks Consider increasing your activity level daily as tolerated, aim for 3-5 times per week of increased heart rate. Consider checking blood sugar at alternate times per day, fasting and 2 hours after a meal. Continue taking medication as directed by MD Adequate and restful sleep is important for your health and controlling blood sugar

## 2017-12-10 DIAGNOSIS — E119 Type 2 diabetes mellitus without complications: Secondary | ICD-10-CM | POA: Diagnosis not present

## 2017-12-10 DIAGNOSIS — H01009 Unspecified blepharitis unspecified eye, unspecified eyelid: Secondary | ICD-10-CM | POA: Diagnosis not present

## 2017-12-10 DIAGNOSIS — H2513 Age-related nuclear cataract, bilateral: Secondary | ICD-10-CM | POA: Diagnosis not present

## 2017-12-10 DIAGNOSIS — H35033 Hypertensive retinopathy, bilateral: Secondary | ICD-10-CM | POA: Diagnosis not present

## 2018-01-07 DIAGNOSIS — H01009 Unspecified blepharitis unspecified eye, unspecified eyelid: Secondary | ICD-10-CM | POA: Diagnosis not present

## 2018-01-07 DIAGNOSIS — H52203 Unspecified astigmatism, bilateral: Secondary | ICD-10-CM | POA: Diagnosis not present

## 2018-02-15 DIAGNOSIS — Z23 Encounter for immunization: Secondary | ICD-10-CM | POA: Diagnosis not present

## 2018-02-15 DIAGNOSIS — E11649 Type 2 diabetes mellitus with hypoglycemia without coma: Secondary | ICD-10-CM | POA: Diagnosis not present

## 2018-02-15 DIAGNOSIS — E782 Mixed hyperlipidemia: Secondary | ICD-10-CM | POA: Diagnosis not present

## 2018-02-15 DIAGNOSIS — Z0000 Encounter for general adult medical examination without abnormal findings: Secondary | ICD-10-CM | POA: Diagnosis not present

## 2018-02-15 DIAGNOSIS — E291 Testicular hypofunction: Secondary | ICD-10-CM | POA: Diagnosis not present

## 2018-03-08 DIAGNOSIS — R05 Cough: Secondary | ICD-10-CM | POA: Diagnosis not present

## 2018-03-08 DIAGNOSIS — J209 Acute bronchitis, unspecified: Secondary | ICD-10-CM | POA: Diagnosis not present

## 2018-03-15 DIAGNOSIS — R05 Cough: Secondary | ICD-10-CM | POA: Diagnosis not present

## 2018-08-18 DIAGNOSIS — E291 Testicular hypofunction: Secondary | ICD-10-CM | POA: Diagnosis not present

## 2018-08-18 DIAGNOSIS — E1169 Type 2 diabetes mellitus with other specified complication: Secondary | ICD-10-CM | POA: Diagnosis not present

## 2018-08-18 DIAGNOSIS — E11649 Type 2 diabetes mellitus with hypoglycemia without coma: Secondary | ICD-10-CM | POA: Diagnosis not present

## 2018-08-18 DIAGNOSIS — E782 Mixed hyperlipidemia: Secondary | ICD-10-CM | POA: Diagnosis not present

## 2018-08-18 DIAGNOSIS — Z23 Encounter for immunization: Secondary | ICD-10-CM | POA: Diagnosis not present

## 2018-10-03 DIAGNOSIS — R21 Rash and other nonspecific skin eruption: Secondary | ICD-10-CM | POA: Diagnosis not present

## 2018-10-05 DIAGNOSIS — S0181XA Laceration without foreign body of other part of head, initial encounter: Secondary | ICD-10-CM | POA: Diagnosis not present

## 2018-10-07 ENCOUNTER — Emergency Department (HOSPITAL_BASED_OUTPATIENT_CLINIC_OR_DEPARTMENT_OTHER)
Admission: EM | Admit: 2018-10-07 | Discharge: 2018-10-07 | Disposition: A | Discharge status: Home / Self Care | Payer: BLUE CROSS/BLUE SHIELD | Attending: Emergency Medicine | Admitting: Emergency Medicine

## 2018-10-07 ENCOUNTER — Other Ambulatory Visit: Payer: Self-pay

## 2018-10-07 ENCOUNTER — Encounter (HOSPITAL_BASED_OUTPATIENT_CLINIC_OR_DEPARTMENT_OTHER): Payer: Self-pay

## 2018-10-07 ENCOUNTER — Emergency Department (HOSPITAL_BASED_OUTPATIENT_CLINIC_OR_DEPARTMENT_OTHER): Payer: BLUE CROSS/BLUE SHIELD

## 2018-10-07 DIAGNOSIS — K5792 Diverticulitis of intestine, part unspecified, without perforation or abscess without bleeding: Secondary | ICD-10-CM | POA: Diagnosis not present

## 2018-10-07 DIAGNOSIS — Z7984 Long term (current) use of oral hypoglycemic drugs: Secondary | ICD-10-CM | POA: Diagnosis not present

## 2018-10-07 DIAGNOSIS — K8020 Calculus of gallbladder without cholecystitis without obstruction: Secondary | ICD-10-CM | POA: Diagnosis not present

## 2018-10-07 DIAGNOSIS — E119 Type 2 diabetes mellitus without complications: Secondary | ICD-10-CM | POA: Insufficient documentation

## 2018-10-07 DIAGNOSIS — Z79899 Other long term (current) drug therapy: Secondary | ICD-10-CM | POA: Diagnosis not present

## 2018-10-07 DIAGNOSIS — Z7982 Long term (current) use of aspirin: Secondary | ICD-10-CM | POA: Insufficient documentation

## 2018-10-07 DIAGNOSIS — R1030 Lower abdominal pain, unspecified: Secondary | ICD-10-CM | POA: Diagnosis not present

## 2018-10-07 DIAGNOSIS — R21 Rash and other nonspecific skin eruption: Secondary | ICD-10-CM | POA: Diagnosis not present

## 2018-10-07 DIAGNOSIS — K5730 Diverticulosis of large intestine without perforation or abscess without bleeding: Secondary | ICD-10-CM | POA: Diagnosis not present

## 2018-10-07 HISTORY — DX: Type 2 diabetes mellitus without complications: E11.9

## 2018-10-07 LAB — BASIC METABOLIC PANEL
Anion gap: 8 (ref 5–15)
BUN: 19 mg/dL (ref 6–20)
CHLORIDE: 102 mmol/L (ref 98–111)
CO2: 25 mmol/L (ref 22–32)
CREATININE: 0.85 mg/dL (ref 0.61–1.24)
Calcium: 9.3 mg/dL (ref 8.9–10.3)
GFR calc Af Amer: >60 mL/min (ref >60)
GFR calc non Af Amer: >60 mL/min (ref >60)
GLUCOSE: 109 mg/dL — AB (ref 70–99)
Potassium: 3.4 mmol/L — ABNORMAL LOW (ref 3.5–5.1)
Sodium: 135 mmol/L (ref 135–145)

## 2018-10-07 LAB — CBC WITH DIFFERENTIAL/PLATELET
Abs Immature Granulocytes: 0.03 10*3/uL (ref 0.00–0.07)
BASOS ABS: 0 10*3/uL (ref 0.0–0.1)
Basophils Relative: 0 %
EOS PCT: 1 %
Eosinophils Absolute: 0.1 10*3/uL (ref 0.0–0.5)
HEMATOCRIT: 44.3 % (ref 39.0–52.0)
HEMOGLOBIN: 14.7 g/dL (ref 13.0–17.0)
Immature Granulocytes: 0 %
LYMPHS ABS: 1 10*3/uL (ref 0.7–4.0)
LYMPHS PCT: 13 %
MCH: 29.8 pg (ref 26.0–34.0)
MCHC: 33.2 g/dL (ref 30.0–36.0)
MCV: 89.9 fL (ref 80.0–100.0)
MONOS PCT: 7 %
Monocytes Absolute: 0.6 10*3/uL (ref 0.1–1.0)
Neutro Abs: 6.4 10*3/uL (ref 1.7–7.7)
Neutrophils Relative %: 79 %
Platelets: 170 10*3/uL (ref 150–400)
RBC: 4.93 MIL/uL (ref 4.22–5.81)
RDW: 12.9 % (ref 11.5–15.5)
WBC: 8.1 10*3/uL (ref 4.0–10.5)
nRBC: 0 % (ref 0.0–0.2)

## 2018-10-07 LAB — URINALYSIS, ROUTINE W REFLEX MICROSCOPIC
BILIRUBIN URINE: NEGATIVE
Glucose, UA: NEGATIVE mg/dL
Hgb urine dipstick: NEGATIVE
Ketones, ur: 15 mg/dL — AB
Leukocytes, UA: NEGATIVE
NITRITE: NEGATIVE
PH: 5.5 (ref 5.0–8.0)
Protein, ur: NEGATIVE mg/dL

## 2018-10-07 MED ORDER — CIPROFLOXACIN HCL 500 MG PO TABS: 500.0000 mg | ORAL_TABLET | Freq: Two times a day (BID) | ORAL | 0 refills | Status: DC

## 2018-10-07 MED ORDER — METRONIDAZOLE 500 MG PO TABS: 500.0000 mg | ORAL_TABLET | Freq: Three times a day (TID) | ORAL | 0 refills | Status: DC

## 2018-10-07 MED ORDER — IOPAMIDOL (ISOVUE-300) INJECTION 61%
100.0000 mL | Freq: Once | INTRAVENOUS | Status: AC | PRN
  Administered 2018-10-07: 100 mL via INTRAVENOUS

## 2018-10-07 NOTE — ED Provider Notes (Signed)
[facility] EMERGENCY DEPARTMENT Provider Note   CSN: [id number] Arrival date & time: 10/07/18  1229     History   Chief Complaint Chief Complaint  Patient presents with  . Abdominal Pain    HPI HANDY MCLOUD is a 57 y.o. male.  Patient is a 57 year old male with history of recently diagnosed diabetes, arthritis, kidney stones evaluation of lower abdominal pain.  He was being seen at the primary doctor's office for allergy testing this morning when his doctor noticed he was walking awkwardly.  When his physician questioned him about this he explained that his lower abdomen was bothering him.  His physician examined his abdomen and felt that he needed to come to the ER for further evaluation.  The patient states he has had this pain for the past several days.  It is mainly suprapubic with no associated nausea, vomiting, constipation, or diarrhea.  He denies any fevers or chills and denies any urinary complaints.  His pain is worse with change in position and palpation.  It is alleviated with rest.     Past Medical History:  Diagnosis Date  . Abnormality of gait and mobility   . Arthritis   . History of kidney stones   . Indigestion   . Pneumonia    history of times one - 2010  . PONV (postoperative nausea and vomiting)   . Wears glasses     Patient Active Problem List   Diagnosis Date Noted  . Newly diagnosed diabetes (HCC) 12/09/2017  . Angina pectoris (HCC) 01/28/2017  . S/P bilateral UKR 10/31/2015    Past Surgical History:  Procedure Laterality Date  . ACL bilat knee     repair   . bilat achilles replacement     . BILATERAL KNEE ARTHROSCOPY    . CERVICAL SPINE SURGERY     C6 secondary to trauma   . HERNIA REPAIR     inguinal - left   . LEFT HEART CATH AND CORONARY ANGIOGRAPHY N/A 01/28/2017   Procedure: Left Heart Cath and Coronary Angiography;  Surgeon: Lyn Records, MD;  Location: [facility] INVASIVE CV LAB;  Service: Cardiovascular;  Laterality: N/A;    . MEDIAL COLLATERAL LIGAMENT REPAIR, KNEE     bilat   . PARTIAL KNEE ARTHROPLASTY Bilateral 10/31/2015   Procedure: UNICOMPARTMENTAL KNEE BILATERAL MEDIALLY;  Surgeon: Durene Romans, MD;  Location: [facility] ORS;  Service: Orthopedics;  Laterality: Bilateral;  . ROTATOR CUFF REPAIR     right shoulder         Home Medications    Prior to Admission medications   Medication Sig Start Date End Date Taking? Authorizing Provider  aspirin EC 81 MG tablet Take 81 mg by mouth daily.    [provider]  calcium carbonate (TUMS - DOSED IN MG ELEMENTAL CALCIUM) 500 MG chewable tablet Chew 3 tablets by mouth at bedtime.    [provider]  clomiPHENE (CLOMID) 50 MG tablet Take 25 mg by mouth daily.    [provider]  glimepiride (AMARYL) 1 MG tablet Take 1 mg by mouth daily with breakfast.    [provider]  ibuprofen (ADVIL,MOTRIN) 200 MG tablet Take 400-600 mg by mouth daily as needed for headache or moderate pain.    [provider]  metFORMIN (GLUCOPHAGE) 500 MG tablet Take by mouth 2 (two) times daily with a meal.    [provider]    Family History Family History  Problem Relation Age of Onset  .  Heart attack Father     Social History Social History   Tobacco Use  . Smoking status: Never Smoker  . Smokeless tobacco: Never Used  Substance Use Topics  . Alcohol use: Yes    Comment: rarely   . Drug use: No     Allergies   Patient has no known allergies.   Review of Systems Review of Systems  All other systems reviewed and are negative.    Physical Exam Updated Vital Signs BP 136/82 (BP Location: Left Arm)   Pulse 87   Temp 98.3 F (36.8 C) (Oral)   Resp 18   Ht 6' (1.829 m)   Wt 130.6 kg   SpO2 99%   BMI 39.06 kg/m   Physical Exam  Constitutional: He is oriented to person, place, and time. He appears well-developed and well-nourished. No distress.  HENT:  Head: Normocephalic and atraumatic.  Mouth/Throat:  Oropharynx is clear and moist.  Neck: Normal range of motion. Neck supple.  Cardiovascular: Normal rate and regular rhythm. Exam reveals no friction rub.  No murmur heard. Pulmonary/Chest: Effort normal and breath sounds normal. No respiratory distress. He has no wheezes. He has no rales.  Abdominal: Soft. Bowel sounds are normal. He exhibits no distension. There is tenderness in the suprapubic area. There is no rigidity, no rebound and no guarding.  Musculoskeletal: Normal range of motion. He exhibits no edema.  Neurological: He is alert and oriented to person, place, and time. Coordination normal.  Skin: Skin is warm and dry. He is not diaphoretic.  Nursing note and vitals reviewed.    ED Treatments / Results  Labs (all labs ordered are listed, but only abnormal results are displayed) Labs Reviewed  BASIC METABOLIC PANEL  CBC WITH DIFFERENTIAL/PLATELET  URINALYSIS, ROUTINE W REFLEX MICROSCOPIC    EKG None  Radiology No results found.  Procedures Procedures (including critical care time)  Medications Ordered in ED Medications - No data to display   Initial Impression / Assessment and Plan / ED Course  I have reviewed the triage vital signs and the nursing notes.  Pertinent labs & imaging results that were available during my care of the patient were reviewed by me and considered in my medical decision making (see chart for details).  Patient sent by his primary doctor for evaluation of abdominal pain.  CT scan shows uncomplicated diverticulitis.  There is no perforation or abscess.  He has no fever or white count and appears clinically well.  He will be discharged with Cipro and Flagyl and follow-up as needed.  Final Clinical Impressions(s) / ED Diagnoses   Final diagnoses:  None    ED Discharge Orders    None       Geoffery Lyonselo, Terrian Sentell, MD 10/07/18 1436

## 2018-10-07 NOTE — ED Notes (Signed)
Patient transported to CT 

## 2018-10-07 NOTE — Discharge Instructions (Addendum)
Cipro and Flagyl as prescribed.  Return to the emergency department if you develop high fever, bloody stool, worsening pain, or other new and concerning symptoms.

## 2018-10-07 NOTE — ED Triage Notes (Signed)
C/o lower mid abd pain x 3 days-was sent by PCP for RLQ palpated pain-states he is taking a new med for hives-NAD-steady gait

## 2018-10-11 DIAGNOSIS — K5792 Diverticulitis of intestine, part unspecified, without perforation or abscess without bleeding: Secondary | ICD-10-CM | POA: Diagnosis not present

## 2018-10-11 DIAGNOSIS — Z09 Encounter for follow-up examination after completed treatment for conditions other than malignant neoplasm: Secondary | ICD-10-CM | POA: Diagnosis not present

## 2018-10-11 DIAGNOSIS — H538 Other visual disturbances: Secondary | ICD-10-CM | POA: Diagnosis not present

## 2018-10-11 DIAGNOSIS — Z79899 Other long term (current) drug therapy: Secondary | ICD-10-CM | POA: Diagnosis not present

## 2018-10-12 DIAGNOSIS — D2310 Other benign neoplasm of skin of unspecified eyelid, including canthus: Secondary | ICD-10-CM | POA: Diagnosis not present

## 2018-10-12 DIAGNOSIS — H21232 Degeneration of iris (pigmentary), left eye: Secondary | ICD-10-CM | POA: Diagnosis not present

## 2018-10-12 DIAGNOSIS — H209 Unspecified iridocyclitis: Secondary | ICD-10-CM | POA: Diagnosis not present

## 2018-10-12 DIAGNOSIS — H01009 Unspecified blepharitis unspecified eye, unspecified eyelid: Secondary | ICD-10-CM | POA: Diagnosis not present

## 2018-10-14 DIAGNOSIS — H21232 Degeneration of iris (pigmentary), left eye: Secondary | ICD-10-CM | POA: Diagnosis not present

## 2018-10-14 DIAGNOSIS — H40052 Ocular hypertension, left eye: Secondary | ICD-10-CM | POA: Diagnosis not present

## 2018-10-14 DIAGNOSIS — H209 Unspecified iridocyclitis: Secondary | ICD-10-CM | POA: Diagnosis not present

## 2018-10-14 DIAGNOSIS — D2310 Other benign neoplasm of skin of unspecified eyelid, including canthus: Secondary | ICD-10-CM | POA: Diagnosis not present

## 2018-10-18 DIAGNOSIS — H209 Unspecified iridocyclitis: Secondary | ICD-10-CM | POA: Diagnosis not present

## 2018-10-18 DIAGNOSIS — H21232 Degeneration of iris (pigmentary), left eye: Secondary | ICD-10-CM | POA: Diagnosis not present

## 2018-10-18 DIAGNOSIS — D2310 Other benign neoplasm of skin of unspecified eyelid, including canthus: Secondary | ICD-10-CM | POA: Diagnosis not present

## 2018-10-18 DIAGNOSIS — H40052 Ocular hypertension, left eye: Secondary | ICD-10-CM | POA: Diagnosis not present

## 2018-11-08 DIAGNOSIS — H21232 Degeneration of iris (pigmentary), left eye: Secondary | ICD-10-CM | POA: Diagnosis not present

## 2018-11-08 DIAGNOSIS — H209 Unspecified iridocyclitis: Secondary | ICD-10-CM | POA: Diagnosis not present

## 2018-11-29 ENCOUNTER — Other Ambulatory Visit: Payer: Self-pay

## 2018-11-29 ENCOUNTER — Encounter (HOSPITAL_BASED_OUTPATIENT_CLINIC_OR_DEPARTMENT_OTHER): Payer: Self-pay

## 2018-11-29 ENCOUNTER — Emergency Department (HOSPITAL_BASED_OUTPATIENT_CLINIC_OR_DEPARTMENT_OTHER): Payer: BLUE CROSS/BLUE SHIELD

## 2018-11-29 ENCOUNTER — Inpatient Hospital Stay (HOSPITAL_BASED_OUTPATIENT_CLINIC_OR_DEPARTMENT_OTHER)
Admission: EM | Admit: 2018-11-29 | Discharge: 2018-12-02 | DRG: 392 | Disposition: A | Discharge status: Home / Self Care | Payer: BLUE CROSS/BLUE SHIELD | Attending: Internal Medicine | Admitting: Internal Medicine

## 2018-11-29 DIAGNOSIS — Z7984 Long term (current) use of oral hypoglycemic drugs: Secondary | ICD-10-CM

## 2018-11-29 DIAGNOSIS — Z7982 Long term (current) use of aspirin: Secondary | ICD-10-CM

## 2018-11-29 DIAGNOSIS — E119 Type 2 diabetes mellitus without complications: Secondary | ICD-10-CM | POA: Diagnosis present

## 2018-11-29 DIAGNOSIS — M1990 Unspecified osteoarthritis, unspecified site: Secondary | ICD-10-CM | POA: Diagnosis present

## 2018-11-29 DIAGNOSIS — K5792 Diverticulitis of intestine, part unspecified, without perforation or abscess without bleeding: Secondary | ICD-10-CM | POA: Diagnosis not present

## 2018-11-29 DIAGNOSIS — Z87442 Personal history of urinary calculi: Secondary | ICD-10-CM | POA: Diagnosis not present

## 2018-11-29 DIAGNOSIS — K5732 Diverticulitis of large intestine without perforation or abscess without bleeding: Principal | ICD-10-CM | POA: Diagnosis present

## 2018-11-29 DIAGNOSIS — E876 Hypokalemia: Secondary | ICD-10-CM | POA: Diagnosis not present

## 2018-11-29 DIAGNOSIS — R109 Unspecified abdominal pain: Secondary | ICD-10-CM | POA: Diagnosis not present

## 2018-11-29 DIAGNOSIS — Z800 Family history of malignant neoplasm of digestive organs: Secondary | ICD-10-CM

## 2018-11-29 DIAGNOSIS — Z8249 Family history of ischemic heart disease and other diseases of the circulatory system: Secondary | ICD-10-CM | POA: Diagnosis not present

## 2018-11-29 DIAGNOSIS — Z79810 Long term (current) use of selective estrogen receptor modulators (SERMs): Secondary | ICD-10-CM

## 2018-11-29 DIAGNOSIS — Z801 Family history of malignant neoplasm of trachea, bronchus and lung: Secondary | ICD-10-CM | POA: Diagnosis not present

## 2018-11-29 DIAGNOSIS — E860 Dehydration: Secondary | ICD-10-CM | POA: Diagnosis not present

## 2018-11-29 DIAGNOSIS — Z96653 Presence of artificial knee joint, bilateral: Secondary | ICD-10-CM | POA: Diagnosis not present

## 2018-11-29 LAB — URINALYSIS, ROUTINE W REFLEX MICROSCOPIC
Bilirubin Urine: NEGATIVE
Glucose, UA: 100 mg/dL — AB
HGB URINE DIPSTICK: NEGATIVE
KETONES UR: 15 mg/dL — AB
Leukocytes, UA: NEGATIVE
Nitrite: NEGATIVE
PROTEIN: NEGATIVE mg/dL
SPECIFIC GRAVITY, URINE: 1.015 (ref 1.005–1.030)
pH: 5.5 (ref 5.0–8.0)

## 2018-11-29 LAB — GLUCOSE, CAPILLARY
Glucose-Capillary: 147 mg/dL — ABNORMAL HIGH (ref 70–99)
Glucose-Capillary: 134 mg/dL — ABNORMAL HIGH (ref 70–99)

## 2018-11-29 LAB — COMPREHENSIVE METABOLIC PANEL
ALBUMIN: 3.8 g/dL (ref 3.5–5.0)
ALK PHOS: 67 U/L (ref 38–126)
ALT: 20 U/L (ref 0–44)
AST: 14 U/L — AB (ref 15–41)
Anion gap: 9 (ref 5–15)
BILIRUBIN TOTAL: 1.7 mg/dL — AB (ref 0.3–1.2)
BUN: 16 mg/dL (ref 6–20)
CALCIUM: 9.1 mg/dL (ref 8.9–10.3)
CO2: 24 mmol/L (ref 22–32)
CREATININE: 0.8 mg/dL (ref 0.61–1.24)
Chloride: 102 mmol/L (ref 98–111)
GFR calc Af Amer: >60 mL/min (ref >60)
GLUCOSE: 215 mg/dL — AB (ref 70–99)
Potassium: 4 mmol/L (ref 3.5–5.1)
Sodium: 135 mmol/L (ref 135–145)
TOTAL PROTEIN: 7.1 g/dL (ref 6.5–8.1)

## 2018-11-29 LAB — HEMOGLOBIN A1C
Hgb A1c MFr Bld: 7.4 % — ABNORMAL HIGH (ref 4.8–5.6)
Mean Plasma Glucose: 165.68 mg/dL

## 2018-11-29 LAB — CBC
HCT: 45.8 % (ref 39.0–52.0)
Hemoglobin: 15.1 g/dL (ref 13.0–17.0)
MCH: 29.5 pg (ref 26.0–34.0)
MCHC: 33 g/dL (ref 30.0–36.0)
MCV: 89.6 fL (ref 80.0–100.0)
PLATELETS: 126 10*3/uL — AB (ref 150–400)
RBC: 5.11 MIL/uL (ref 4.22–5.81)
RDW: 13.4 % (ref 11.5–15.5)
WBC: 11.5 10*3/uL — ABNORMAL HIGH (ref 4.0–10.5)
nRBC: 0 % (ref 0.0–0.2)

## 2018-11-29 LAB — LIPASE, BLOOD: Lipase: 23 U/L (ref 11–51)

## 2018-11-29 LAB — CBG MONITORING, ED: Glucose-Capillary: 178 mg/dL — ABNORMAL HIGH (ref 70–99)

## 2018-11-29 MED ORDER — SODIUM CHLORIDE 0.9 % IV BOLUS (SEPSIS)
1000.0000 mL | Freq: Once | INTRAVENOUS | Status: AC
  Administered 2018-11-29: 1000 mL via INTRAVENOUS

## 2018-11-29 MED ORDER — SODIUM CHLORIDE 0.9% FLUSH
3.0000 mL | Freq: Once | INTRAVENOUS | Status: DC
  Filled 2018-11-29: qty 3

## 2018-11-29 MED ORDER — ENOXAPARIN SODIUM 40 MG/0.4ML ~~LOC~~ SOLN
40.0000 mg | SUBCUTANEOUS | Status: DC
  Administered 2018-11-29 – 2018-12-01 (×3): 40 mg via SUBCUTANEOUS
  Filled 2018-11-29 (×2): qty 0.4

## 2018-11-29 MED ORDER — CALCIUM CARBONATE ANTACID 500 MG PO CHEW
3.0000 | CHEWABLE_TABLET | Freq: Every day | ORAL | Status: DC
  Administered 2018-11-29 – 2018-12-01 (×3): 600 mg via ORAL
  Filled 2018-11-29 (×3): qty 3

## 2018-11-29 MED ORDER — IOPAMIDOL (ISOVUE-300) INJECTION 61%
100.0000 mL | Freq: Once | INTRAVENOUS | Status: AC | PRN
  Administered 2018-11-29: 100 mL via INTRAVENOUS

## 2018-11-29 MED ORDER — FENTANYL CITRATE (PF) 100 MCG/2ML IJ SOLN
100.0000 ug | Freq: Once | INTRAMUSCULAR | Status: AC
  Administered 2018-11-29: 100 ug via INTRAVENOUS
  Filled 2018-11-29: qty 2

## 2018-11-29 MED ORDER — INSULIN ASPART 100 UNIT/ML ~~LOC~~ SOLN
0.0000 [IU] | Freq: Three times a day (TID) | SUBCUTANEOUS | Status: DC
  Administered 2018-11-29 – 2018-11-30 (×3): 2 [IU] via SUBCUTANEOUS
  Administered 2018-11-30 – 2018-12-01 (×3): 3 [IU] via SUBCUTANEOUS
  Administered 2018-12-01 – 2018-12-02 (×2): 2 [IU] via SUBCUTANEOUS

## 2018-11-29 MED ORDER — PIPERACILLIN-TAZOBACTAM 3.375 G IVPB
3.3750 g | Freq: Three times a day (TID) | INTRAVENOUS | Status: DC
  Administered 2018-11-29 – 2018-12-02 (×8): 3.375 g via INTRAVENOUS
  Filled 2018-11-29 (×10): qty 50

## 2018-11-29 MED ORDER — INSULIN ASPART 100 UNIT/ML ~~LOC~~ SOLN: 0.0000 [IU] | Freq: Every day | SUBCUTANEOUS | Status: DC

## 2018-11-29 MED ORDER — ONDANSETRON HCL 4 MG/2ML IJ SOLN: 4.0000 mg | Freq: Four times a day (QID) | INTRAMUSCULAR | Status: DC | PRN

## 2018-11-29 MED ORDER — SODIUM CHLORIDE 0.9 % IV SOLN
INTRAVENOUS | Status: AC
  Administered 2018-11-29: 14:00:00 via INTRAVENOUS

## 2018-11-29 MED ORDER — HYDROMORPHONE HCL 1 MG/ML IJ SOLN
1.0000 mg | INTRAMUSCULAR | Status: DC | PRN
  Administered 2018-11-29 – 2018-11-30 (×7): 1 mg via INTRAVENOUS
  Filled 2018-11-29 (×7): qty 1

## 2018-11-29 MED ORDER — PIPERACILLIN-TAZOBACTAM 3.375 G IVPB 30 MIN
3.3750 g | Freq: Once | INTRAVENOUS | Status: AC
  Administered 2018-11-29: 3.375 g via INTRAVENOUS
  Filled 2018-11-29 (×2): qty 50

## 2018-11-29 MED ORDER — ACETAMINOPHEN 650 MG RE SUPP: 650.0000 mg | Freq: Four times a day (QID) | RECTAL | Status: DC | PRN

## 2018-11-29 MED ORDER — ACETAMINOPHEN 325 MG PO TABS
650.0000 mg | ORAL_TABLET | Freq: Four times a day (QID) | ORAL | Status: DC | PRN
  Administered 2018-12-01 – 2018-12-02 (×2): 650 mg via ORAL
  Filled 2018-11-29 (×2): qty 2

## 2018-11-29 MED ORDER — ONDANSETRON HCL 4 MG PO TABS: 4.0000 mg | ORAL_TABLET | Freq: Four times a day (QID) | ORAL | Status: DC | PRN

## 2018-11-29 NOTE — Progress Notes (Signed)
Pharmacy Antibiotic Note  Russell [name] is a 58 y.o. male admitted on 11/29/2018 with IAI.  Pharmacy has been consulted for zosyn dosing. He was given zosyn 3.357 gm IV over 30 minutes at [facility] at 8 am  Plan: Zosyn 3.375g IV q8h (4 hour infusion).  Pharmacy to sign off  Height: 6' (182.9 cm) Weight: 290 lb (131.5 kg) IBW/kg (Calculated) : 77.6  Temp (24hrs), Avg:98.8 F (37.1 C), Min:98.3 F (36.8 C), Max:99.2 F (37.3 C)  Recent Labs  Lab 11/29/18 0608  WBC 11.5*  CREATININE 0.80    Estimated Creatinine Clearance: 142.9 mL/min (by C-G formula based on SCr of 0.8 mg/dL).    No Known Allergies Thank you for allowing pharmacy to be a part of this patient's care.  Herby Abraham, Pharm.D [phone] 11/29/2018 12:18 PM

## 2018-11-29 NOTE — ED Provider Notes (Signed)
[facility] EMERGENCY DEPARTMENT Provider Note   CSN: [id number] Arrival date & time: 11/29/18  0544     History   Chief Complaint Chief Complaint  Patient presents with  . Abdominal Pain    HPI Russell [name] is a 58 y.o. male.  The history is provided by the patient.  Abdominal Pain  Pain location:  LLQ Pain quality: aching   Pain radiates to:  Does not radiate Pain severity:  Severe Onset quality:  Gradual Duration:  2 days Timing:  Constant Progression:  Worsening Chronicity:  New Relieved by:  Nothing Worsened by:  Palpation and movement Associated symptoms: constipation and hematuria   Associated symptoms: no chest pain, no cough, no diarrhea, no dysuria, no fever and no vomiting   Patient history of diabetes presents with quadrant abdominal pain.  Started 3 days ago.  It is worsening.  He reports history of diverticulitis last month but it improved with antibiotics, but this pain is more severe.  He called his PCP yesterday morning and was started on Cipro/Flagyl.  He has taken 2 rounds of those medications without any improvement.  He reports constipation, last bowel movement over 24 hours ago.  No vomiting. Previous history of hernia repair  Past Medical History:  Diagnosis Date  . Abnormality of gait and mobility   . Arthritis   . Diabetes mellitus without complication (HCC)   . History of kidney stones   . Indigestion   . Pneumonia    history of times one - 2010  . PONV (postoperative nausea and vomiting)   . Wears glasses     Patient Active Problem List   Diagnosis Date Noted  . Newly diagnosed diabetes (HCC) 12/09/2017  . Angina pectoris (HCC) 01/28/2017  . S/P bilateral UKR 10/31/2015    Past Surgical History:  Procedure Laterality Date  . ACL bilat knee     repair   . bilat achilles replacement     . BILATERAL KNEE ARTHROSCOPY    . CERVICAL SPINE SURGERY     C6 secondary to trauma   . HERNIA REPAIR     inguinal - left   .  LEFT HEART CATH AND CORONARY ANGIOGRAPHY N/A 01/28/2017   Procedure: Left Heart Cath and Coronary Angiography;  Surgeon: Lyn RecordsHenry W Smith, MD;  [facility];  Service: Cardiovascular;  Laterality: N/A;  . MEDIAL COLLATERAL LIGAMENT REPAIR, KNEE     bilat   . PARTIAL KNEE ARTHROPLASTY Bilateral 10/31/2015   Procedure: UNICOMPARTMENTAL KNEE BILATERAL MEDIALLY;  Surgeon: Durene RomansMatthew Olin, MD;  Location: [facility] ORS;  Service: Orthopedics;  Laterality: Bilateral;  . ROTATOR CUFF REPAIR     right shoulder         Home Medications    Prior to Admission medications   Medication Sig Start Date End Date Taking? Authorizing Provider  aspirin EC 81 MG tablet Take 81 mg by mouth daily.    [provider]  calcium carbonate (TUMS - DOSED IN MG ELEMENTAL CALCIUM) 500 MG chewable tablet Chew 3 tablets by mouth at bedtime.    [provider]  clomiPHENE (CLOMID) 50 MG tablet Take 25 mg by mouth daily.    [provider]  glimepiride (AMARYL) 1 MG tablet Take 1 mg by mouth daily with breakfast.    [provider]  ibuprofen (ADVIL,MOTRIN) 200 MG tablet Take 400-600 mg by mouth daily as needed for headache or moderate pain.    [provider]  metFORMIN (GLUCOPHAGE) 500  MG tablet Take by mouth 2 (two) times daily with a meal.    [provider]    Family History Family History  Problem Relation Age of Onset  . Heart attack Father     Social History Social History   Tobacco Use  . Smoking status: Never Smoker  . Smokeless tobacco: Never Used  Substance Use Topics  . Alcohol use: Yes    Comment: rarely   . Drug use: No     Allergies   Patient has no known allergies.   Review of Systems Review of Systems  Constitutional: Negative for fever.  Respiratory: Negative for cough.   Cardiovascular: Negative for chest pain.  Gastrointestinal: Positive for abdominal pain and constipation. Negative for diarrhea and vomiting.    Genitourinary: Positive for hematuria. Negative for dysuria.  All other systems reviewed and are negative.    Physical Exam Updated Vital Signs BP (!) 145/80 (BP Location: Right Arm)   Pulse (!) 106   Temp 98.3 F (36.8 C) (Oral)   Resp 18   Ht 1.829 m (6')   Wt 131.5 kg   SpO2 96%   BMI 39.33 kg/m   Physical Exam CONSTITUTIONAL: Well developed/well nourished, uncomfortable appearing HEAD: Normocephalic/atraumatic EYES: EOMI ENMT: Mucous membranes moist NECK: supple no meningeal signs CV: S1/S2 noted, no murmurs/rubs/gallops noted LUNGS: Lungs are clear to auscultation bilaterally, no apparent distress ABDOMEN: soft, moderate left lower quadrant tenderness, no rebound, bowel sounds noted throughout abdomen GU:  no inguinal hernia, no scrotal tenderness, nurse chaperone present for exam NEURO: Pt is awake/alert/appropriate, moves all extremitiesx4.  No facial droop.   EXTREMITIES: pulses normal/equal, full ROM SKIN: warm, color normal PSYCH: anxious   ED Treatments / Results  Labs (all labs ordered are listed, but only abnormal results are displayed) Labs Reviewed  COMPREHENSIVE METABOLIC PANEL - Abnormal; Notable for the following components:      Result Value   Glucose, Bld 215 (*)    AST 14 (*)    Total Bilirubin 1.7 (*)    All other components within normal limits  CBC - Abnormal; Notable for the following components:   WBC 11.5 (*)    Platelets 126 (*)    All other components within normal limits  LIPASE, BLOOD  URINALYSIS, ROUTINE W REFLEX MICROSCOPIC    EKG None  Radiology No results found.  Procedures Procedures    Medications Ordered in ED Medications  sodium chloride flush (NS) 0.9 % injection 3 mL (has no administration in time range)  iopamidol (ISOVUE-300) 61 % injection 100 mL (has no administration in time range)  fentaNYL (SUBLIMAZE) injection 100 mcg (100 mcg Intravenous Given 11/29/18 0620)  sodium chloride 0.9 % bolus 1,000 mL  (1,000 mLs Intravenous New Bag/Given 11/29/18 0609)  fentaNYL (SUBLIMAZE) injection 100 mcg (100 mcg Intravenous Given 11/29/18 0647)     Initial Impression / Assessment and Plan / ED Course  I have reviewed the triage vital signs and the nursing notes.  Pertinent labs  results that were available during my care of the patient were reviewed by me and considered in my medical decision making (see chart for details).     6:44 AM Patient with previous history of diverticulitis last month.  He reports his pain is much more severe on this visit.  On repeat assessment, he still has significant tenderness in the left lower quadrant with guarding.  Due to body size, can not palpate hernia though this is still a possibility   Patient  feels this may be  different than previous diverticulitis. Will proceed with CT imaging 6:55 AM D/w Dr. Madilyn Hook at signout to f/u on CT imaging Final Clinical Impressions(s) / ED Diagnoses   Final diagnoses:  None    ED Discharge Orders    None       Zadie Rhine, MD 11/29/18 [phone]

## 2018-11-29 NOTE — ED Triage Notes (Signed)
Pt started to have abdominal pain on Sunday, hx of diverticulitis

## 2018-11-29 NOTE — H&P (Addendum)
History and Physical    Russell SaneJeffrey O [name] ZOX:[id number] DOB: 03/23/1961 DOA: 11/29/2018  PCP: Daisy Florooss, Charles Alan, MD  Patient coming from: Home  Chief Complaint: LLQ Pain  HPI: Russell SaneJeffrey O Alms is a 58 y.o. male with medical history significant of DM2, low testosterone, arthritis who presents for LLQ abdominal pain, subjective fevers and ws noted on CT scan to have recurrent diverticulitis.  He reports that on Sunday he developed LLQ pain that slowly worsened and by Monday night, was a 13/10.  He had pain medication in the ED which brought this down to a 5/10.  He is very comfortable on exam.  He notes that the pain is sharp, in a circumscribed area in his LLQ and does not radiated.  He thinks he may have had a subjective fever yesterday.  He notes no inciting event, but he did do a lot of labor in his barn on Saturday, became dehydrated and noticed darkened urine and possibly blood in his urine.  He has not had a BM since Sunday morning, despite taking metamucil.  He feels dehydrated.  No dizziness or lightheadedness, no nausea or vomiting, no chest pain, chills.  Reports also that he has been eating a lot of cashews and attempting to stay on a keto diet.   ED Course: In the ED, he was treated with fentanyl and his pain is improved.  He had a CT scan of the abdomen showing diverticulitis and phlegmon, but no distinct abscess.  He had a mildly elevated WBC.  He was started on Zosyn.    Review of Systems: As per HPI otherwise 10 point review of systems negative.    Past Medical History:  Diagnosis Date  . Abnormality of gait and mobility   . Arthritis   . Diabetes mellitus without complication (HCC)   . History of kidney stones   . Indigestion   . Pneumonia    history of times one - 2010  . PONV (postoperative nausea and vomiting)   . Wears glasses     Past Surgical History:  Procedure Laterality Date  . ACL bilat knee     repair   . bilat achilles replacement     . BILATERAL KNEE  ARTHROSCOPY    . CERVICAL SPINE SURGERY     C6 secondary to trauma   . HERNIA REPAIR     inguinal - left   . LEFT HEART CATH AND CORONARY ANGIOGRAPHY N/A 01/28/2017   Procedure: Left Heart Cath and Coronary Angiography;  Surgeon: Lyn RecordsHenry W Smith, MD;  [facility];  Service: Cardiovascular;  Laterality: N/A;  . MEDIAL COLLATERAL LIGAMENT REPAIR, KNEE     bilat   . PARTIAL KNEE ARTHROPLASTY Bilateral 10/31/2015   Procedure: UNICOMPARTMENTAL KNEE BILATERAL MEDIALLY;  Surgeon: Durene RomansMatthew Olin, MD;  Location: [facility] ORS;  Service: Orthopedics;  Laterality: Bilateral;  . ROTATOR CUFF REPAIR     right shoulder      reports that he has never smoked. He has never used smokeless tobacco. He reports current alcohol use. He reports that he does not use drugs.  No Known Allergies  Family History  Problem Relation Age of Onset  . Heart attack Father   . Lung cancer Father   . Heart disease Mother   . Pancreatic cancer Mother      Prior to Admission medications   Medication Sig Start Date End Date Taking? Authorizing Provider  aspirin EC 81 MG tablet Take 81 mg by mouth daily.  [provider]  calcium carbonate (TUMS - DOSED IN MG ELEMENTAL CALCIUM) 500 MG chewable tablet Chew 3 tablets by mouth at bedtime.    [provider]         glimepiride (AMARYL) 1 MG tablet Take 1 mg by mouth daily with breakfast.    [provider]  ibuprofen (ADVIL,MOTRIN) 200 MG tablet Take 400-600 mg by mouth daily as needed for headache or moderate pain.    [provider]  metFORMIN (GLUCOPHAGE) 500 MG tablet Take by mouth 2 (two) times daily with a meal.    [provider]    Physical Exam:  Constitutional: Sitting on side of bed, very comfortable Vitals:   11/29/18 0555 11/29/18 0827 11/29/18 0914 11/29/18 1201  BP:  121/78 129/68 (!) 136/94  Pulse:  93 92 95  Resp:  20 18 18   Temp:  99.1 F (37.3 C) 99.2 F (37.3 C) 98.7 F (37.1 C)  TempSrc:   Oral Oral Oral  SpO2:  96% 95% 98%  Weight: 131.5 kg     Height: 6' (1.829 m)      Eyes:  lids and conjunctivae normal ENMT: Mucous membranes are dry. Posterior pharynx clear of any exudate or lesions. Normal dentition.  Respiratory: CTAB, no wheezing, rales, breathing comfortably  Cardiovascular: RR, NR, no murmur, pulses palpable in radial arteries Abdomen: Mild distention, LLQ tenderness with guarding, decreased bowel sounds, obese Musculoskeletal: no clubbing / cyanosis.Normal muscle tone.  Skin: no rashes, lesions, ulcers on exposed skin.  He has a chronically ruddy complexion Neurologic: Grossly intact, moving easily in bed, sensation intact to light touch Psychiatric: Normal judgment and insight. Alert and oriented x 3. Normal mood.    Labs on Admission: I have personally reviewed following labs and imaging studies  CBC: Recent Labs  Lab 11/29/18 0608  WBC 11.5*  HGB 15.1  HCT 45.8  MCV 89.6  PLT 126*   Basic Metabolic Panel: Recent Labs  Lab 11/29/18 0608  NA 135  K 4.0  CL 102  CO2 24  GLUCOSE 215*  BUN 16  CREATININE 0.80  CALCIUM 9.1   GFR: Estimated Creatinine Clearance: 142.9 mL/min (by C-G formula based on SCr of 0.8 mg/dL). Liver Function Tests: Recent Labs  Lab 11/29/18 0608  AST 14*  ALT 20  ALKPHOS 67  BILITOT 1.7*  PROT 7.1  ALBUMIN 3.8   Recent Labs  Lab 11/29/18 0608  LIPASE 23   No results for input(s): AMMONIA in the last 168 hours. Coagulation Profile: No results for input(s): INR, PROTIME in the last 168 hours. Cardiac Enzymes: No results for input(s): CKTOTAL, CKMB, CKMBINDEX, TROPONINI in the last 168 hours. BNP (last 3 results) No results for input(s): PROBNP in the last 8760 hours. HbA1C: No results for input(s): HGBA1C in the last 72 hours. CBG: Recent Labs  Lab 11/29/18 1055  GLUCAP 178*   Lipid Profile: No results for input(s): CHOL, HDL, LDLCALC, TRIG, CHOLHDL, LDLDIRECT in the last 72 hours. Thyroid Function  Tests: No results for input(s): TSH, T4TOTAL, FREET4, T3FREE, THYROIDAB in the last 72 hours. Anemia Panel: No results for input(s): VITAMINB12, FOLATE, FERRITIN, TIBC, IRON, RETICCTPCT in the last 72 hours. Urine analysis:    Component Value Date/Time   COLORURINE ORANGE (A) 11/29/2018 0608   APPEARANCEUR CLEAR 11/29/2018 0608   LABSPEC 1.015 11/29/2018 0608   PHURINE 5.5 11/29/2018 0608   GLUCOSEU 100 (A) 11/29/2018 0608   HGBUR NEGATIVE 11/29/2018 0608   BILIRUBINUR NEGATIVE 11/29/2018 [id number]  KETONESUR 15 (A) 11/29/2018 0608   PROTEINUR NEGATIVE 11/29/2018 0608   NITRITE NEGATIVE 11/29/2018 0608   LEUKOCYTESUR NEGATIVE 11/29/2018 1610    Radiological Exams on Admission: Ct Abdomen Pelvis W Contrast  Result Date: 11/29/2018 CLINICAL DATA:  Left-sided abdominal pain over the last 3 days. History of diverticulitis. EXAM: CT ABDOMEN AND PELVIS WITH CONTRAST TECHNIQUE: Multidetector CT imaging of the abdomen and pelvis was performed using the standard protocol following bolus administration of intravenous contrast. CONTRAST:  ISOVUE-300 IOPAMIDOL (ISOVUE-300) INJECTION 61% COMPARISON:  10/07/2018 FINDINGS: Lower chest: Mild linear atelectasis in both lower lobes. Hepatobiliary: No liver parenchymal lesion. Small gallstones dependent in the gallbladder as seen previously. No CT evidence of cholecystitis or obstruction. Pancreas: Normal Spleen: Normal Adrenals/Urinary Tract: Adrenal glands are normal. Kidneys are normal except for a simple cyst on the right. Bladder is normal. Stomach/Bowel: Stomach and small bowel appear normal. Persistent and slightly worsened changes of sigmoid diverticulitis. The amount of stranding of the regional fat is increased. Small area of phlegmonous inflammation within the fat without discretely identified abscess. Vascular/Lymphatic: Aortic atherosclerosis. No aneurysm. IVC is normal. No retroperitoneal adenopathy. Reproductive: Normal Other: No free fluid or  air. Previous left inguinal hernia repair. Musculoskeletal: Ordinary lower lumbar degenerative changes. IMPRESSION: Persistent and slightly worsened changes of sigmoid diverticulitis. The amount of stranding of the regional fat is increased. Small area of phlegmonous inflammation within the fat without discretely identified abscess. Electronically Signed   By: Paulina Fusi M.D.   On: 11/29/2018 07:23     Assessment/Plan Recurrent Diverticulitis - 2nd episode in 2 months, possibly related to diet - His pain worsened on oral antibiotics, doing better on pain medication and started on Zosyn - No frank abscess seen on CT scan, but possibly early developing abscess - continue Zosyn - Dilaudid for pain - He looks very well at this time, he would like to try clears as opposed to strict NPO. I advised him that if pain worsened or he were to have a decline, he would need to go back to NPO - IVF with NS at 100cc/hr for 10 hours, reassess fluid status at that time - Trend WBC - T Bili mildly elevated, trend with AM CMET - Trend WBC - mildly elevated at 11.5 - Tylenol for fever  DM2 - on oral medications - Hold oral medications including metformin and glimepiride - SSI - Check A1C  Arthritis - Pain control with dilaudid - Avoid NSAIDs in the acute setting - reports he takes a lot of ibuprofen.   Subjective hematuria - UA done in the ED showed no Hgb - Possibly related to decreased urine output in the setting of dehydration   DVT prophylaxis: Lovenox  Code Status: Full  Disposition Plan: Admit for IV antibiotics  Consults called: None  Admission status: Obs, Med Surg - pain seemed to be his main issue, if continues uncontrolled tomorrow, may need to switch to inpatient and continue to receive IV antibiotics    Debe Coder MD [facility]  If 7PM-7AM, please contact night-coverage [email or web address]  11/29/2018, 12:50 PM

## 2018-11-29 NOTE — Care Management (Signed)
Called by Dr. Madilyn Hook for admission.   Patient is a 58 year old man with PMH of DM2.  Presented in December for diverticulitis to the [facility] and was treated outpatient with Cipro/Flagyl.  He improved and was doing better, however, a few days ago his LLQ pain returned and was more severe than previous.  He called his PCP, apparently and was given a repeat course of cipro/flag, completed 2 doses.  However, the pain was worsening and he came to the ED.  CT scan in the ED showed worsening diverticulitis with developing phlegmon, no distinct abscess.   Vital signs are stable from this morning.  He has had some mild sinus tachycardia, reported as improved by Dr. Madilyn Hook.  Blood work showed normal renal function.  Mild elevated in bilirubin to 1.7, WBC of 11.5 and platelets of 126.  He was started on Zosyn.  No EKG to review.   He will be admitted to Med Surg for IV antibiotics and monitoring.    Debe Coder, MD

## 2018-11-29 NOTE — ED Provider Notes (Signed)
Patient care assumed at oh 700 pending CT abdomen and pelvis. Patient here with recurrent abdominal pain since Sunday. He was recently treated with antibiotics for diverticulitis. He called his PCP yesterday and has taken two doses of ciprofloxacin and Flagyl with worsening pain. Subjective fevers at home. He does have significant left lower quadrant tenderness with voluntary guarding. CT abdomen and pelvis demonstrates recurrent and worsening diverticulitis. Due to concern for outpatient treatment failure will provide with IV antibiotics and admit for further management. Will provide IV antibiotics it admission for further management due to treatment failure of oral antibiotics. Patient updated findings of studies and recommendation for admission and he is in agreement with treatment plan. Hospitalist, Dr. Sabino Gasser, consulted for admission.   Russell Fossa, MD 11/29/18 [phone]

## 2018-11-30 DIAGNOSIS — Z7982 Long term (current) use of aspirin: Secondary | ICD-10-CM | POA: Diagnosis not present

## 2018-11-30 DIAGNOSIS — E860 Dehydration: Secondary | ICD-10-CM | POA: Diagnosis present

## 2018-11-30 DIAGNOSIS — Z800 Family history of malignant neoplasm of digestive organs: Secondary | ICD-10-CM | POA: Diagnosis not present

## 2018-11-30 DIAGNOSIS — K5732 Diverticulitis of large intestine without perforation or abscess without bleeding: Secondary | ICD-10-CM | POA: Diagnosis present

## 2018-11-30 DIAGNOSIS — Z96653 Presence of artificial knee joint, bilateral: Secondary | ICD-10-CM | POA: Diagnosis present

## 2018-11-30 DIAGNOSIS — Z8249 Family history of ischemic heart disease and other diseases of the circulatory system: Secondary | ICD-10-CM | POA: Diagnosis not present

## 2018-11-30 DIAGNOSIS — Z79810 Long term (current) use of selective estrogen receptor modulators (SERMs): Secondary | ICD-10-CM | POA: Diagnosis not present

## 2018-11-30 DIAGNOSIS — Z7984 Long term (current) use of oral hypoglycemic drugs: Secondary | ICD-10-CM | POA: Diagnosis not present

## 2018-11-30 DIAGNOSIS — Z801 Family history of malignant neoplasm of trachea, bronchus and lung: Secondary | ICD-10-CM | POA: Diagnosis not present

## 2018-11-30 DIAGNOSIS — M1990 Unspecified osteoarthritis, unspecified site: Secondary | ICD-10-CM | POA: Diagnosis present

## 2018-11-30 DIAGNOSIS — Z87442 Personal history of urinary calculi: Secondary | ICD-10-CM | POA: Diagnosis not present

## 2018-11-30 DIAGNOSIS — E119 Type 2 diabetes mellitus without complications: Secondary | ICD-10-CM | POA: Diagnosis present

## 2018-11-30 DIAGNOSIS — E876 Hypokalemia: Secondary | ICD-10-CM | POA: Diagnosis not present

## 2018-11-30 DIAGNOSIS — K5792 Diverticulitis of intestine, part unspecified, without perforation or abscess without bleeding: Secondary | ICD-10-CM | POA: Diagnosis not present

## 2018-11-30 LAB — BASIC METABOLIC PANEL
ANION GAP: 10 (ref 5–15)
BUN: 15 mg/dL (ref 6–20)
CO2: 23 mmol/L (ref 22–32)
Calcium: 8.7 mg/dL — ABNORMAL LOW (ref 8.9–10.3)
Chloride: 103 mmol/L (ref 98–111)
Creatinine, Ser: 0.97 mg/dL (ref 0.61–1.24)
GFR calc Af Amer: >60 mL/min (ref >60)
GFR calc non Af Amer: >60 mL/min (ref >60)
Glucose, Bld: 162 mg/dL — ABNORMAL HIGH (ref 70–99)
Potassium: 3.7 mmol/L (ref 3.5–5.1)
Sodium: 136 mmol/L (ref 135–145)

## 2018-11-30 LAB — CBC
HCT: 43.6 % (ref 39.0–52.0)
HEMOGLOBIN: 15 g/dL (ref 13.0–17.0)
MCH: 30.9 pg (ref 26.0–34.0)
MCHC: 34.4 g/dL (ref 30.0–36.0)
MCV: 89.7 fL (ref 80.0–100.0)
Platelets: UNDETERMINED 10*3/uL (ref 150–400)
RBC: 4.86 MIL/uL (ref 4.22–5.81)
RDW: 13.8 % (ref 11.5–15.5)
WBC: 9.8 10*3/uL (ref 4.0–10.5)
nRBC: 0 % (ref 0.0–0.2)

## 2018-11-30 LAB — HIV ANTIBODY (ROUTINE TESTING W REFLEX): HIV Screen 4th Generation wRfx: NONREACTIVE

## 2018-11-30 LAB — GLUCOSE, CAPILLARY
Glucose-Capillary: 169 mg/dL — ABNORMAL HIGH (ref 70–99)
Glucose-Capillary: 149 mg/dL — ABNORMAL HIGH (ref 70–99)
Glucose-Capillary: 129 mg/dL — ABNORMAL HIGH (ref 70–99)
Glucose-Capillary: 144 mg/dL — ABNORMAL HIGH (ref 70–99)

## 2018-11-30 MED ORDER — SENNOSIDES-DOCUSATE SODIUM 8.6-50 MG PO TABS
3.0000 | ORAL_TABLET | Freq: Two times a day (BID) | ORAL | Status: DC
  Administered 2018-11-30 (×2): 3 via ORAL
  Filled 2018-11-30 (×3): qty 3

## 2018-11-30 MED ORDER — SODIUM CHLORIDE 0.9 % IV SOLN
INTRAVENOUS | Status: DC
  Administered 2018-11-30 – 2018-12-01 (×3): via INTRAVENOUS

## 2018-11-30 MED ORDER — RISAQUAD PO CAPS
1.0000 | ORAL_CAPSULE | Freq: Three times a day (TID) | ORAL | Status: DC
  Administered 2018-11-30 – 2018-12-02 (×6): 1 via ORAL
  Filled 2018-11-30 (×6): qty 1

## 2018-11-30 NOTE — Progress Notes (Signed)
PROGRESS NOTE                                                                                                                                                                                                             Patient Demographics:    Russell [name], is a 58 y.o. male, DOB - 04-12-1961, XBJ:[id number]  Admit date - 11/29/2018   Admitting Physician Inez Catalina, MD  Outpatient Primary MD for the patient is Daisy Floro, MD  LOS - 0   Chief Complaint  Patient presents with  . Abdominal Pain       Brief Narrative    58 y.o. male with medical history significant of DM2, low testosterone, arthritis who presents for LLQ abdominal pain, subjective fevers and ws noted on CT scan to have recurrent diverticulitis.  He reports that on Sunday he developed LLQ pain that slowly worsened and by Monday night, his work-up significant for recurrent acute diverticulitis, admitted for further management and IV antibiotics.  Subjective:    Russell [name] today ports abdominal pain, nausea, no vomiting, reports no bowel movement yet .  Assessment  & Plan :    Active Problems:   Diverticulitis   Acute diverticulitis   Reurrent acute diverticulitis -This is his second episode, one was 10/07/2018, treated with a oral regimen, presents again with abdominal pain, CT abdomen pelvis showing persistent and slightly worsening changes of his sigmoid diverticulitis, and increased stranding, with small area of phlegmon or inflammation within the fat, he appears to be quite tender today, uncomfortable, required already 2 doses of Dilaudid today, will continue with IV Zosyn today, he is now ready to be transitioned to oral regimen, I will keep him n.p.o. to allow him to rest his bowel not to worsen his diverticulosis, will keep 100 cc/h's, PRN pain and nausea medication, will start on stool softeners, but no laxatives continue to monitor clinically, if no improvement  in 24 hours clinically I will consult general surgery.   DM2 - on oral medications - Hold oral medications including metformin and glimepiride - SSI  Arthritis - Pain control with dilaudid - Avoid NSAIDs in the acute setting - reports he takes a lot of ibuprofen.      Code Status : Full  Family Communication  : None at bedside  Disposition Plan  : Home  when stable  Barriers For Discharge : Still with significant abdominal pain, requiring multiple doses of IV pain medications, dehydrated, on IV fluid 100 cc/h, n.p.o., will still need IV antibiotics for another 1 to 2 days, especially in the setting of recent diverticulitis, which was treated with oral regiment, despite that appears to be worsening clinically and on imaging  Consults  :  None  Procedures  : None  DVT Prophylaxis  :  SCD, SCLovenox  Lab Results  Component Value Date   PLT PLATELET CLUMPS NOTED ON SMEAR, UNABLE TO ESTIMATE 11/30/2018    Antibiotics  :   Anti-infectives (From admission, onward)   Start     Dose/Rate Route Frequency Ordered Stop   11/29/18 1400  piperacillin-tazobactam (ZOSYN) IVPB 3.375 g     3.375 g 12.5 mL/hr over 240 Minutes Intravenous Every 8 hours 11/29/18 1217     11/29/18 0745  piperacillin-tazobactam (ZOSYN) IVPB 3.375 g     3.375 g 100 mL/hr over 30 Minutes Intravenous  Once 11/29/18 0738 11/29/18 0839        Objective:   Vitals:   11/29/18 0914 11/29/18 1201 11/29/18 2100 11/30/18 0600  BP: 129/68 (!) 136/94 (!) 142/85 (!) 143/84  Pulse: 92 95 90 88  Resp: 18 18 18 18   Temp: 99.2 F (37.3 C) 98.7 F (37.1 C) 99.8 F (37.7 C) 98.2 F (36.8 C)  TempSrc: Oral Oral Oral Oral  SpO2: 95% 98% 98% 98%  Weight:  131.5 kg    Height:  6' (1.829 m)      Wt Readings from Last 3 Encounters:  11/29/18 131.5 kg  10/07/18 130.6 kg  01/28/17 (!) 140.6 kg     Intake/Output Summary (Last 24 hours) at 11/30/2018 1256 Last data filed at 11/30/2018 0800 Gross per 24 hour    Intake 50 ml  Output -  Net 50 ml     Physical Exam  Awake Alert, Oriented X 3, No new F.N deficits, Normal affect Symmetrical Chest wall movement, Good air movement bilaterally, CTAB RRR,No Gallops,Rubs or new Murmurs, No Parasternal Heave +ve B.Sounds, with mild diffuse abdominal tenderness, but significant left lower quadrant tenderness to palpation, with no rebound. No Cyanosis, Clubbing or edema, No new Rash or bruise     Data Review:    CBC Recent Labs  Lab 11/29/18 0608 11/30/18 0348  WBC 11.5* 9.8  HGB 15.1 15.0  HCT 45.8 43.6  PLT 126* PLATELET CLUMPS NOTED ON SMEAR, UNABLE TO ESTIMATE  MCV 89.6 89.7  MCH 29.5 30.9  MCHC 33.0 34.4  RDW 13.4 13.8    Chemistries  Recent Labs  Lab 11/29/18 0608 11/30/18 0348  NA 135 136  K 4.0 3.7  CL 102 103  CO2 24 23  GLUCOSE 215* 162*  BUN 16 15  CREATININE 0.80 0.97  CALCIUM 9.1 8.7*  AST 14*  --   ALT 20  --   ALKPHOS 67  --   BILITOT 1.7*  --    ------------------------------------------------------------------------------------------------------------------ No results for input(s): CHOL, HDL, LDLCALC, TRIG, CHOLHDL, LDLDIRECT in the last 72 hours.  Lab Results  Component Value Date   HGBA1C 7.4 (H) 11/29/2018   ------------------------------------------------------------------------------------------------------------------ No results for input(s): TSH, T4TOTAL, T3FREE, THYROIDAB in the last 72 hours.  Invalid input(s): FREET3 ------------------------------------------------------------------------------------------------------------------ No results for input(s): VITAMINB12, FOLATE, FERRITIN, TIBC, IRON, RETICCTPCT in the last 72 hours.  Coagulation profile No results for input(s): INR, PROTIME in the last 168 hours.  No results for input(s): DDIMER in  the last 72 hours.  Cardiac Enzymes No results for input(s): CKMB, TROPONINI, MYOGLOBIN in the last 168 hours.  Invalid input(s):  CK ------------------------------------------------------------------------------------------------------------------ No results found for: BNP  Inpatient Medications  Scheduled Meds: . acidophilus  1 capsule Oral TID  . calcium carbonate  3 tablet Oral QHS  . enoxaparin (LOVENOX) injection  40 mg Subcutaneous Q24H  . insulin aspart  0-15 Units Subcutaneous TID WC  . insulin aspart  0-5 Units Subcutaneous QHS  . senna-docusate  3 tablet Oral BID  . sodium chloride flush  3 mL Intravenous Once   Continuous Infusions: . sodium chloride 100 mL/hr at 11/30/18 1233  . piperacillin-tazobactam (ZOSYN)  IV 3.375 g (11/30/18 0544)   PRN Meds:.acetaminophen **OR** acetaminophen, HYDROmorphone (DILAUDID) injection, ondansetron **OR** ondansetron (ZOFRAN) IV  Micro Results No results found for this or any previous visit (from the past 240 hour(s)).  Radiology Reports Ct Abdomen Pelvis W Contrast  Result Date: 11/29/2018 CLINICAL DATA:  Left-sided abdominal pain over the last 3 days. History of diverticulitis. EXAM: CT ABDOMEN AND PELVIS WITH CONTRAST TECHNIQUE: Multidetector CT imaging of the abdomen and pelvis was performed using the standard protocol following bolus administration of intravenous contrast. CONTRAST:  100mL ISOVUE-300 IOPAMIDOL (ISOVUE-300) INJECTION 61% COMPARISON:  10/07/2018 FINDINGS: Lower chest: Mild linear atelectasis in both lower lobes. Hepatobiliary: No liver parenchymal lesion. Small gallstones dependent in the gallbladder as seen previously. No CT evidence of cholecystitis or obstruction. Pancreas: Normal Spleen: Normal Adrenals/Urinary Tract: Adrenal glands are normal. Kidneys are normal except for a simple cyst on the right. Bladder is normal. Stomach/Bowel: Stomach and small bowel appear normal. Persistent and slightly worsened changes of sigmoid diverticulitis. The amount of stranding of the regional fat is increased. Small area of phlegmonous inflammation within the  fat without discretely identified abscess. Vascular/Lymphatic: Aortic atherosclerosis. No aneurysm. IVC is normal. No retroperitoneal adenopathy. Reproductive: Normal Other: No free fluid or air. Previous left inguinal hernia repair. Musculoskeletal: Ordinary lower lumbar degenerative changes. IMPRESSION: Persistent and slightly worsened changes of sigmoid diverticulitis. The amount of stranding of the regional fat is increased. Small area of phlegmonous inflammation within the fat without discretely identified abscess. Electronically Signed   By: Paulina FusiMark  Shogry M.D.   On: 11/29/2018 07:23      Huey Bienenstockawood Elgergawy M.D on 11/30/2018 at 12:56 PM  Between 7am to 7pm - Pager - [phone]  After 7pm go to [email or web address] - password [email or web address]  [facility] -  Office  [phone]

## 2018-11-30 NOTE — Progress Notes (Signed)
Report handed off  to Merriam Rn who will resume care. 

## 2018-12-01 LAB — CBC
HCT: 43.5 % (ref 39.0–52.0)
Hemoglobin: 14.2 g/dL (ref 13.0–17.0)
MCH: 29.3 pg (ref 26.0–34.0)
MCHC: 32.6 g/dL (ref 30.0–36.0)
MCV: 89.9 fL (ref 80.0–100.0)
PLATELETS: 136 10*3/uL — AB (ref 150–400)
RBC: 4.84 MIL/uL (ref 4.22–5.81)
RDW: 13.4 % (ref 11.5–15.5)
WBC: 7 10*3/uL (ref 4.0–10.5)
nRBC: 0 % (ref 0.0–0.2)

## 2018-12-01 LAB — GLUCOSE, CAPILLARY
Glucose-Capillary: 154 mg/dL — ABNORMAL HIGH (ref 70–99)
Glucose-Capillary: 158 mg/dL — ABNORMAL HIGH (ref 70–99)
Glucose-Capillary: 133 mg/dL — ABNORMAL HIGH (ref 70–99)
Glucose-Capillary: 119 mg/dL — ABNORMAL HIGH (ref 70–99)

## 2018-12-01 LAB — BASIC METABOLIC PANEL
Anion gap: 11 (ref 5–15)
BUN: 14 mg/dL (ref 6–20)
CO2: 24 mmol/L (ref 22–32)
CREATININE: 0.75 mg/dL (ref 0.61–1.24)
Calcium: 8.7 mg/dL — ABNORMAL LOW (ref 8.9–10.3)
Chloride: 101 mmol/L (ref 98–111)
GFR calc Af Amer: >60 mL/min (ref >60)
Glucose, Bld: 141 mg/dL — ABNORMAL HIGH (ref 70–99)
Potassium: 3 mmol/L — ABNORMAL LOW (ref 3.5–5.1)
Sodium: 136 mmol/L (ref 135–145)

## 2018-12-01 MED ORDER — POTASSIUM CHLORIDE CRYS ER 20 MEQ PO TBCR
40.0000 meq | EXTENDED_RELEASE_TABLET | Freq: Four times a day (QID) | ORAL | Status: AC
  Administered 2018-12-01 (×2): 40 meq via ORAL
  Filled 2018-12-01 (×2): qty 2

## 2018-12-01 MED ORDER — HYDROMORPHONE HCL 1 MG/ML IJ SOLN: 0.5000 mg | INTRAMUSCULAR | Status: DC | PRN

## 2018-12-01 NOTE — Progress Notes (Signed)
PROGRESS NOTE                                                                                                                                                                                                             Patient Demographics:    Russell FaesJeffrey [name], is a 58 y.o. male, DOB - 07/18/1961, WGN:[id number]  Admit date - 11/29/2018   Admitting Physician Inez CatalinaEmily B Mullen, MD  Outpatient Primary MD for the patient is Daisy Florooss, Charles Alan, MD  LOS - 1   Chief Complaint  Patient presents with  . Abdominal Pain       Brief Narrative    58 y.o. male with medical history significant of DM2, low testosterone, arthritis who presents for LLQ abdominal pain, subjective fevers and ws noted on CT scan to have recurrent diverticulitis.  He reports that on Sunday he developed LLQ pain that slowly worsened and by Monday night, his work-up significant for recurrent acute diverticulitis, admitted for further management and IV antibiotics.   Subjective:    Russell FaesJeffrey [name] today reports his abdominal pain has subsided, no nausea, no vomiting, he did have bowel movements earlier today   Assessment  & Plan :    Active Problems:   Diverticulitis   Acute diverticulitis   Reurrent acute diverticulitis -Reports most recent colonoscopy was 6 to 7 years ago, reports he had no acute findings 10  -Patient second episode, most recent episode was last month in December, he was treated with oral antibiotic as an outpatient, now he comes with recurrent acute diverticulitis, with worsening physical exam and imaging , continue with IV Zosyn, appears to be improving, will continue with current IV regimen, with IV fluids, PRN pain and nausea medication, will start on full liquid diet and monitor clinically .  DM2 - on oral medications - Hold oral medications including metformin and glimepiride - SSI  Arthritis - Avoid NSAIDs in the acute setting - reports he takes a lot of  ibuprofen.   Hypokalemia -repleted, recheck in a.m.    Code Status : Full  Family Communication  : None at bedside  Disposition Plan  : Home when stable  Barriers For Discharge : Still with significant abdominal pain, requiring multiple doses of IV pain medications, dehydrated, on IV fluid 100 cc/h, n.p.o., will still need IV antibiotics for another 1 to  2 days, especially in the setting of recent diverticulitis, which was treated with oral regiment, despite that appears to be worsening clinically and on imaging  Consults  :  None  Procedures  : None  DVT Prophylaxis  :  SCD, SCLovenox  Lab Results  Component Value Date   PLT 136 (L) 12/01/2018    Antibiotics  :   Anti-infectives (From admission, onward)   Start     Dose/Rate Route Frequency Ordered Stop   11/29/18 1400  piperacillin-tazobactam (ZOSYN) IVPB 3.375 g     3.375 g 12.5 mL/hr over 240 Minutes Intravenous Every 8 hours 11/29/18 1217     11/29/18 0745  piperacillin-tazobactam (ZOSYN) IVPB 3.375 g     3.375 g 100 mL/hr over 30 Minutes Intravenous  Once 11/29/18 0738 11/29/18 0839        Objective:   Vitals:   11/30/18 0600 11/30/18 1343 11/30/18 1959 12/01/18 0542  BP: (!) 143/84 (!) 146/77 (!) 144/80 (!) 144/87  Pulse: 88 83 85 85  Resp: 18 18 18 18   Temp: 98.2 F (36.8 C) 98.8 F (37.1 C) 98.4 F (36.9 C) 98 F (36.7 C)  TempSrc: Oral Oral Oral Oral  SpO2: 98% 94% 94% 97%  Weight:      Height:        Wt Readings from Last 3 Encounters:  11/29/18 131.5 kg  10/07/18 130.6 kg  01/28/17 (!) 140.6 kg     Intake/Output Summary (Last 24 hours) at 12/01/2018 1240 Last data filed at 12/01/2018 0900 Gross per 24 hour  Intake 2371.67 ml  Output -  Net 2371.67 ml     Physical Exam  Awake Alert, Oriented X 3, No new F.N deficits, Normal affect Symmetrical Chest wall movement, Good air movement bilaterally, CTAB RRR,No Gallops,Rubs or new Murmurs, No Parasternal Heave +ve B.Sounds, Abd Soft,  tenderness is localized today in the left lower quadrant, No rebound - guarding or rigidity. No Cyanosis, Clubbing or edema, No new Rash or bruise      Data Review:    CBC Recent Labs  Lab 11/29/18 0608 11/30/18 0348 12/01/18 0358  WBC 11.5* 9.8 7.0  HGB 15.1 15.0 14.2  HCT 45.8 43.6 43.5  PLT 126* PLATELET CLUMPS NOTED ON SMEAR, UNABLE TO ESTIMATE 136*  MCV 89.6 89.7 89.9  MCH 29.5 30.9 29.3  MCHC 33.0 34.4 32.6  RDW 13.4 13.8 13.4    Chemistries  Recent Labs  Lab 11/29/18 0608 11/30/18 0348 12/01/18 0358  NA 135 136 136  K 4.0 3.7 3.0*  CL 102 103 101  CO2 24 23 24   GLUCOSE 215* 162* 141*  BUN 16 15 14   CREATININE 0.80 0.97 0.75  CALCIUM 9.1 8.7* 8.7*  AST 14*  --   --   ALT 20  --   --   ALKPHOS 67  --   --   BILITOT 1.7*  --   --    ------------------------------------------------------------------------------------------------------------------ No results for input(s): CHOL, HDL, LDLCALC, TRIG, CHOLHDL, LDLDIRECT in the last 72 hours.  Lab Results  Component Value Date   HGBA1C 7.4 (H) 11/29/2018   ------------------------------------------------------------------------------------------------------------------ No results for input(s): TSH, T4TOTAL, T3FREE, THYROIDAB in the last 72 hours.  Invalid input(s): FREET3 ------------------------------------------------------------------------------------------------------------------ No results for input(s): VITAMINB12, FOLATE, FERRITIN, TIBC, IRON, RETICCTPCT in the last 72 hours.  Coagulation profile No results for input(s): INR, PROTIME in the last 168 hours.  No results for input(s): DDIMER in the last 72 hours.  Cardiac Enzymes No results for  input(s): CKMB, TROPONINI, MYOGLOBIN in the last 168 hours.  Invalid input(s): CK ------------------------------------------------------------------------------------------------------------------ No results found for: BNP  Inpatient Medications  Scheduled  Meds: . acidophilus  1 capsule Oral TID  . calcium carbonate  3 tablet Oral QHS  . enoxaparin (LOVENOX) injection  40 mg Subcutaneous Q24H  . insulin aspart  0-15 Units Subcutaneous TID WC  . insulin aspart  0-5 Units Subcutaneous QHS  . potassium chloride  40 mEq Oral Q6H  . senna-docusate  3 tablet Oral BID  . sodium chloride flush  3 mL Intravenous Once   Continuous Infusions: . sodium chloride 75 mL/hr at 12/01/18 0832  . piperacillin-tazobactam (ZOSYN)  IV 3.375 g (12/01/18 0547)   PRN Meds:.acetaminophen **OR** acetaminophen, HYDROmorphone (DILAUDID) injection, ondansetron **OR** ondansetron (ZOFRAN) IV  Micro Results No results found for this or any previous visit (from the past 240 hour(s)).  Radiology Reports Ct Abdomen Pelvis W Contrast  Result Date: 11/29/2018 CLINICAL DATA:  Left-sided abdominal pain over the last 3 days. History of diverticulitis. EXAM: CT ABDOMEN AND PELVIS WITH CONTRAST TECHNIQUE: Multidetector CT imaging of the abdomen and pelvis was performed using the standard protocol following bolus administration of intravenous contrast. CONTRAST:  ISOVUE-300 IOPAMIDOL (ISOVUE-300) INJECTION 61% COMPARISON:  10/07/2018 FINDINGS: Lower chest: Mild linear atelectasis in both lower lobes. Hepatobiliary: No liver parenchymal lesion. Small gallstones dependent in the gallbladder as seen previously. No CT evidence of cholecystitis or obstruction. Pancreas: Normal Spleen: Normal Adrenals/Urinary Tract: Adrenal glands are normal. Kidneys are normal except for a simple cyst on the right. Bladder is normal. Stomach/Bowel: Stomach and small bowel appear normal. Persistent and slightly worsened changes of sigmoid diverticulitis. The amount of stranding of the regional fat is increased. Small area of phlegmonous inflammation within the fat without discretely identified abscess. Vascular/Lymphatic: Aortic atherosclerosis. No aneurysm. IVC is normal. No retroperitoneal adenopathy.  Reproductive: Normal Other: No free fluid or air. Previous left inguinal hernia repair. Musculoskeletal: Ordinary lower lumbar degenerative changes. IMPRESSION: Persistent and slightly worsened changes of sigmoid diverticulitis. The amount of stranding of the regional fat is increased. Small area of phlegmonous inflammation within the fat without discretely identified abscess. Electronically Signed   By: Paulina Fusi M.D.   On: 11/29/2018 07:23      Huey Bienenstock M.D on 12/01/2018 at 12:40 PM  Between 7am to 7pm - Pager - [phone]  After 7pm go to [email or web address] - password [facility]  [facility] -  Office  [phone]

## 2018-12-02 LAB — BASIC METABOLIC PANEL
Anion gap: 6 (ref 5–15)
BUN: 13 mg/dL (ref 6–20)
CALCIUM: 9.3 mg/dL (ref 8.9–10.3)
CO2: 27 mmol/L (ref 22–32)
Chloride: 107 mmol/L (ref 98–111)
Creatinine, Ser: 0.84 mg/dL (ref 0.61–1.24)
GFR calc Af Amer: >60 mL/min (ref >60)
GFR calc non Af Amer: >60 mL/min (ref >60)
Glucose, Bld: 146 mg/dL — ABNORMAL HIGH (ref 70–99)
Potassium: 3.4 mmol/L — ABNORMAL LOW (ref 3.5–5.1)
SODIUM: 140 mmol/L (ref 135–145)

## 2018-12-02 LAB — GLUCOSE, CAPILLARY
Glucose-Capillary: 137 mg/dL — ABNORMAL HIGH (ref 70–99)
Glucose-Capillary: 175 mg/dL — ABNORMAL HIGH (ref 70–99)

## 2018-12-02 MED ORDER — PIPERACILLIN-TAZOBACTAM 3.375 G IVPB
3.3750 g | Freq: Three times a day (TID) | INTRAVENOUS | Status: DC
  Filled 2018-12-02: qty 50

## 2018-12-02 MED ORDER — RISAQUAD PO CAPS: 1.0000 | ORAL_CAPSULE | Freq: Three times a day (TID) | ORAL | 0 refills | Status: AC

## 2018-12-02 MED ORDER — POTASSIUM CHLORIDE CRYS ER 20 MEQ PO TBCR
40.0000 meq | EXTENDED_RELEASE_TABLET | Freq: Once | ORAL | Status: AC
  Administered 2018-12-02: 40 meq via ORAL
  Filled 2018-12-02: qty 2

## 2018-12-02 MED ORDER — PIPERACILLIN-TAZOBACTAM 3.375 G IVPB 30 MIN
3.3750 g | Freq: Once | INTRAVENOUS | Status: AC
  Administered 2018-12-02: 3.375 g via INTRAVENOUS
  Filled 2018-12-02: qty 50

## 2018-12-02 MED ORDER — ACETAMINOPHEN 325 MG PO TABS: 650.0000 mg | ORAL_TABLET | Freq: Four times a day (QID) | ORAL | Status: AC | PRN

## 2018-12-02 MED ORDER — AMOXICILLIN-POT CLAVULANATE 875-125 MG PO TABS: 1.0000 | ORAL_TABLET | Freq: Two times a day (BID) | ORAL | 0 refills | Status: AC

## 2018-12-02 NOTE — Plan of Care (Signed)
Patient requesting Tylenonl PRN

## 2018-12-02 NOTE — Discharge Instructions (Signed)
Follow with Primary MD Russell Floro, MD in 7 days   Get CBC, CMP,checked  by Primary MD next visit.    Activity: As tolerated with Full fall precautions use walker/cane & assistance as needed   Disposition Home    Diet: Continue with full liquid diet, and advance to low residue diet and few days , all to be carb modified   On your next visit with your primary care physician please Get Medicines reviewed and adjusted.   Please request your Prim.MD to go over all Hospital Tests and Procedure/Radiological results at the follow up, please get all Hospital records sent to your Prim MD by signing hospital release before you go home.   If you experience worsening of your admission symptoms, develop shortness of breath, life threatening emergency, suicidal or homicidal thoughts you must seek medical attention immediately by calling 911 or calling your MD immediately  if symptoms less severe.  You Must read complete instructions/literature along with all the possible adverse reactions/side effects for all the Medicines you take and that have been prescribed to you. Take any new Medicines after you have completely understood and accpet all the possible adverse reactions/side effects.   Do not drive, operating heavy machinery, perform activities at heights, swimming or participation in water activities or provide baby sitting services if your were admitted for syncope or siezures until you have seen by Primary MD or a Neurologist and advised to do so again.  Do not drive when taking Pain medications.    Do not take more than prescribed Pain, Sleep and Anxiety Medications  Special Instructions: If you have smoked or chewed Tobacco  in the last 2 yrs please stop smoking, stop any regular Alcohol  and or any Recreational drug use.  Wear Seat belts while driving.   Please note  You were cared for by a hospitalist during your hospital stay. If you have any questions about your discharge  medications or the care you received while you were in the hospital after you are discharged, you can call the unit and asked to speak with the hospitalist on call if the hospitalist that took care of you is not available. Once you are discharged, your primary care physician will handle any further medical issues. Please note that NO REFILLS for any discharge medications will be authorized once you are discharged, as it is imperative that you return to your primary care physician (or establish a relationship with a primary care physician if you do not have one) for your aftercare needs so that they can reassess your need for medications and monitor your lab values.

## 2018-12-02 NOTE — Discharge Summary (Signed)
Russell [name], is a 58 y.o. [name]  DOB 06/30/1961  MRN [id number].  Admission date:  11/29/2018  Admitting Physician  Russell CatalinaEmily B Mullen, [name]  Discharge Date:  12/02/2018   Primary [name]  Russell [name]  Recommendations for primary care physician for things to follow:  -Please check CBC, BMP during next visit, please consider referral to GI if continues to have recurrent episodes of diverticulitis.  Admission Diagnosis  Acute diverticulitis [K57.92]   Discharge Diagnosis  Acute diverticulitis [K57.92]    Active Problems:   Diverticulitis   Acute diverticulitis      Past Medical History:  Diagnosis Date  . Abnormality of gait and mobility   . Arthritis   . Diabetes mellitus without complication (HCC)   . History of kidney stones   . Indigestion   . Pneumonia    history of times one - 2010  . PONV (postoperative nausea and vomiting)   . Wears glasses     Past Surgical History:  Procedure Laterality Date  . ACL bilat knee     repair   . bilat achilles replacement     . BILATERAL KNEE ARTHROSCOPY    . CERVICAL SPINE SURGERY     C6 secondary to trauma   . HERNIA REPAIR     inguinal - left   . LEFT HEART CATH AND CORONARY ANGIOGRAPHY N/A 01/28/2017   Procedure: Left Heart Cath and Coronary Angiography;  Surgeon: Lyn RecordsHenry W Smith, [name];  [facility];  Service: Cardiovascular;  Laterality: N/A;  . MEDIAL COLLATERAL LIGAMENT REPAIR, KNEE     bilat   . PARTIAL KNEE ARTHROPLASTY Bilateral 10/31/2015   Procedure: UNICOMPARTMENTAL KNEE BILATERAL MEDIALLY;  Surgeon: Durene RomansMatthew Olin, [name];  Location: [facility] ORS;  Service: Orthopedics;  Laterality: Bilateral;  . ROTATOR CUFF REPAIR     right shoulder        History of present illness and  Hospital Course:     Kindly see H&P for history of present illness and admission details, please review complete Labs, Consult reports and Test reports  for all details in brief  HPI  from the history and physical done on the day of admission 11/29/2018  HPI: Russell [name] is a 58 y.o. [name] with medical history significant of DM2, low testosterone, arthritis who presents for LLQ abdominal pain, subjective fevers and ws noted on CT scan to have recurrent diverticulitis.  He reports that on Sunday he developed LLQ pain that slowly worsened and by Monday night, was a 13/10.  He had pain medication in the ED which brought this down to a 5/10.  He is very comfortable on exam.  He notes that the pain is sharp, in a circumscribed area in his LLQ and does not radiated.  He thinks he may have had a subjective fever yesterday.  He notes no inciting event, but he did do a lot of labor in his barn on Saturday, became dehydrated and noticed darkened urine and possibly blood in his urine.  He has not had a BM since Sunday morning, despite taking metamucil.  He feels dehydrated.  No dizziness or lightheadedness, no nausea or vomiting, no chest pain, chills.  Reports also that he has been eating a lot of cashews and attempting to stay on a keto diet.   ED Course: In the ED, he was treated with fentanyl and his pain is improved.  He had a CT scan of the abdomen showing diverticulitis and phlegmon, but no distinct abscess.  He had a mildly elevated WBC.  He was started on Zosyn.   Hospital Course   58 y.o.malewith medical history significant ofDM2, low testosterone, arthritis who presents for LLQ abdominal pain, subjective fevers and ws noted on CT scan to have recurrent diverticulitis. He reports that on Sunday he developed LLQ pain that slowly worsened and by Monday night, his work-up significant for recurrent acute diverticulitis, admitted for further management and IV antibiotics.  Reurrent acute diverticulitis -Reports most recent colonoscopy was 6 to 7 years ago, reports he had no acute findings . -Patient second episode, most recent episode was last month  in December, he was treated with oral antibiotic as an outpatient, now he comes with recurrent acute diverticulitis, with worsening physical exam and imaging ,  was treated with IV Zosyn during hospital stay, significant improvement of his symptoms, his pain significantly subsided today, tolerating full liquid diet, he is afebrile, he has no leukocytosis, asking if he can be discharged today, which I think it is appropriate, safe total of 4 days of Zosyn during hospital stay, he will be discharged on another 6 days of oral Augmentin to finish total of 10 days treatment given this is his second episode and couple month, he was instructed to continue with full liquid diet for the next couple days, then advance to low residual, carb modified after that, and if this becomes a recurrent problem, then he needs to be seen by GI as I have discussed with .  DM2 - on oral medications -Resume home meds  Arthritis -Instructed him to decrease his ibuprofen use,   Hypokalemia -repleted   Discharge Condition:  stable   Follow UP  Follow-up Information    Russell Floro, [name] Follow up in 1 week(s).   Specialty:  Family Medicine Contact information: [address] [place] [address] [phone]             Discharge Instructions  and  Discharge Medications    Discharge Instructions    Discharge instructions   Complete by:  As directed    Follow with Primary [name] Russell Floro, [name] in 7 days   Get CBC, CMP,checked  by Primary [name] next visit.    Activity: As tolerated with Full fall precautions use walker/cane & assistance as needed   Disposition Home    Diet: Continue with full liquid diet, and advance to low residue diet and few days , all to be carb modified   On your next visit with your primary care physician please Get Medicines reviewed and adjusted.   Please request your Prim.[name] to go over all Hospital Tests and Procedure/Radiological results at the follow  up, please get all Hospital records sent to your Prim [name] by signing hospital release before you go home.   If you experience worsening of your admission symptoms, develop shortness of breath, life threatening emergency, suicidal or homicidal thoughts you must seek medical attention immediately by calling 911 or calling your [name] immediately  if symptoms  less severe.  You Must read complete instructions/literature along with all the possible adverse reactions/side effects for all the Medicines you take and that have been prescribed to you. Take any new Medicines after you have completely understood and accpet all the possible adverse reactions/side effects.   Do not drive, operating heavy machinery, perform activities at heights, swimming or participation in water activities or provide baby sitting services if your were admitted for syncope or siezures until you have seen by Primary [name] or a Neurologist and advised to do so again.  Do not drive when taking Pain medications.    Do not take more than prescribed Pain, Sleep and Anxiety Medications  Special Instructions: If you have smoked or chewed Tobacco  in the last 2 yrs please stop smoking, stop any regular Alcohol  and or any Recreational drug use.  Wear Seat belts while driving.   Please note  You were cared for by a hospitalist during your hospital stay. If you have any questions about your discharge medications or the care you received while you were in the hospital after you are discharged, you can call the unit and asked to speak with the hospitalist on call if the hospitalist that took care of you is not available. Once you are discharged, your primary care physician will handle any further medical issues. Please note that NO REFILLS for any discharge medications will be authorized once you are discharged, as it is imperative that you return to your primary care physician (or establish a relationship with a primary care physician if you do  not have one) for your aftercare needs so that they can reassess your need for medications and monitor your lab values.   Increase activity slowly   Complete by:  As directed      Allergies as of 12/02/2018   No Known Allergies     Medication List    STOP taking these medications   ciprofloxacin 500 MG tablet Commonly known as:  CIPRO   ibuprofen 200 MG tablet Commonly known as:  ADVIL,MOTRIN   metroNIDAZOLE 500 MG tablet Commonly known as:  FLAGYL     TAKE these medications   acetaminophen 325 MG tablet Commonly known as:  TYLENOL Take 2 tablets (650 mg total) by mouth every 6 (six) hours as needed for mild pain (or Fever >/= 101).   acidophilus Caps capsule Take 1 capsule by mouth 3 (three) times daily.   amoxicillin-clavulanate 875-125 MG tablet Commonly known as:  AUGMENTIN Take 1 tablet by mouth 2 (two) times daily for 6 days.   aspirin EC 81 MG tablet Take 81 mg by mouth daily.   calcium carbonate 500 MG chewable tablet Commonly known as:  TUMS - dosed in mg elemental calcium Chew 3 tablets by mouth at bedtime.   clomiPHENE 50 MG tablet Commonly known as:  CLOMID Take 25 mg by mouth daily.   glimepiride 1 MG tablet Commonly known as:  AMARYL Take 1 mg by mouth daily with breakfast.   metFORMIN 500 MG tablet Commonly known as:  GLUCOPHAGE Take by mouth 2 (two) times daily with a meal.         Diet and Activity recommendation: See Discharge Instructions above   Consults obtained -  none   Major procedures and Radiology Reports - PLEASE review detailed and final reports for all details, in brief -      Ct Abdomen Pelvis W Contrast  Result Date: 11/29/2018 CLINICAL DATA:  Left-sided abdominal pain over  the last 3 days. History of diverticulitis. EXAM: CT ABDOMEN AND PELVIS WITH CONTRAST TECHNIQUE: Multidetector CT imaging of the abdomen and pelvis was performed using the standard protocol following bolus administration of intravenous contrast.  CONTRAST:  ISOVUE-300 IOPAMIDOL (ISOVUE-300) INJECTION 61% COMPARISON:  10/07/2018 FINDINGS: Lower chest: Mild linear atelectasis in both lower lobes. Hepatobiliary: No liver parenchymal lesion. Small gallstones dependent in the gallbladder as seen previously. No CT evidence of cholecystitis or obstruction. Pancreas: Normal Spleen: Normal Adrenals/Urinary Tract: Adrenal glands are normal. Kidneys are normal except for a simple cyst on the right. Bladder is normal. Stomach/Bowel: Stomach and small bowel appear normal. Persistent and slightly worsened changes of sigmoid diverticulitis. The amount of stranding of the regional fat is increased. Small area of phlegmonous inflammation within the fat without discretely identified abscess. Vascular/Lymphatic: Aortic atherosclerosis. No aneurysm. IVC is normal. No retroperitoneal adenopathy. Reproductive: Normal Other: No free fluid or air. Previous left inguinal hernia repair. Musculoskeletal: Ordinary lower lumbar degenerative changes. IMPRESSION: Persistent and slightly worsened changes of sigmoid diverticulitis. The amount of stranding of the regional fat is increased. Small area of phlegmonous inflammation within the fat without discretely identified abscess. Electronically Signed   By: Paulina Fusi M.D.   On: 11/29/2018 07:23    Micro Results     No results found for this or any previous visit (from the past 240 hour(s)).     Today   Subjective:   Russell [name] today has no headache,no chest pain, no fever, no chills, terminal pain has significantly subsided, he reports is almost resolved   Objective:   Blood pressure 120/74, pulse 72, temperature 98 F (36.7 C), temperature source Oral, resp. rate 18, height 6' (1.829 m), weight 131.5 kg, SpO2 98 %.   Intake/Output Summary (Last 24 hours) at 12/02/2018 0913 Last data filed at 12/02/2018 0601 Gross per 24 hour  Intake 2562.39 ml  Output 0 ml  Net 2562.39 ml    Exam Awake Alert,  Oriented x 3, No new F.N deficits, Normal affect Symmetrical Chest wall movement, Good air movement bilaterally, CTAB RRR,No Gallops,Rubs or new Murmurs, No Parasternal Heave +ve B.Sounds, Abd Soft, minimal left lower quadrant tenderness to palpation on deep palpation, no rebound -guarding or rigidity. No Cyanosis, Clubbing or edema, No new Rash or bruise  Data Review   CBC w Diff:  Lab Results  Component Value Date   WBC 7.0 12/01/2018   HGB 14.2 12/01/2018   HGB 16.7 01/26/2017   HCT 43.5 12/01/2018   HCT 48.4 01/26/2017   PLT 136 (L) 12/01/2018   PLT 160 01/26/2017   LYMPHOPCT 13 10/07/2018   MONOPCT 7 10/07/2018   EOSPCT 1 10/07/2018   BASOPCT 0 10/07/2018    CMP:  Lab Results  Component Value Date   NA 140 12/02/2018   NA 139 01/26/2017   K 3.4 (L) 12/02/2018   CL 107 12/02/2018   CO2 27 12/02/2018   BUN 13 12/02/2018   BUN 17 01/26/2017   CREATININE 0.84 12/02/2018   PROT 7.1 11/29/2018   ALBUMIN 3.8 11/29/2018   BILITOT 1.7 (H) 11/29/2018   ALKPHOS 67 11/29/2018   AST 14 (L) 11/29/2018   ALT 20 11/29/2018  .   Total Time in preparing paper work, data evaluation and todays exam - 35 minutes  Huey Bienenstock M.D on 12/02/2018 at 9:13 AM  [facility]   Office  [phone]

## 2018-12-12 DIAGNOSIS — K5792 Diverticulitis of intestine, part unspecified, without perforation or abscess without bleeding: Secondary | ICD-10-CM | POA: Diagnosis not present

## 2018-12-22 DIAGNOSIS — K5792 Diverticulitis of intestine, part unspecified, without perforation or abscess without bleeding: Secondary | ICD-10-CM | POA: Diagnosis not present

## 2019-01-03 ENCOUNTER — Other Ambulatory Visit: Payer: Self-pay | Admitting: Surgery

## 2019-01-03 DIAGNOSIS — K5792 Diverticulitis of intestine, part unspecified, without perforation or abscess without bleeding: Secondary | ICD-10-CM

## 2019-01-06 ENCOUNTER — Ambulatory Visit
Admission: RE | Admit: 2019-01-06 | Discharge: 2019-01-06 | Disposition: A | Discharge status: Home / Self Care | Payer: BLUE CROSS/BLUE SHIELD | Source: Ambulatory Visit | Attending: Surgery | Admitting: Surgery

## 2019-01-06 DIAGNOSIS — K5732 Diverticulitis of large intestine without perforation or abscess without bleeding: Secondary | ICD-10-CM | POA: Diagnosis not present

## 2019-01-06 DIAGNOSIS — K5792 Diverticulitis of intestine, part unspecified, without perforation or abscess without bleeding: Secondary | ICD-10-CM

## 2019-01-06 MED ORDER — IOPAMIDOL (ISOVUE-300) INJECTION 61%
100.0000 mL | Freq: Once | INTRAVENOUS | Status: AC | PRN
  Administered 2019-01-06: 100 mL via INTRAVENOUS

## 2019-02-25 ENCOUNTER — Emergency Department (HOSPITAL_BASED_OUTPATIENT_CLINIC_OR_DEPARTMENT_OTHER)
Admission: EM | Admit: 2019-02-25 | Discharge: 2019-02-25 | Disposition: A | Discharge status: Home / Self Care | Payer: BLUE CROSS/BLUE SHIELD | Attending: Emergency Medicine | Admitting: Emergency Medicine

## 2019-02-25 ENCOUNTER — Encounter (HOSPITAL_BASED_OUTPATIENT_CLINIC_OR_DEPARTMENT_OTHER): Payer: Self-pay | Admitting: *Deleted

## 2019-02-25 ENCOUNTER — Other Ambulatory Visit: Payer: Self-pay

## 2019-02-25 DIAGNOSIS — Z79899 Other long term (current) drug therapy: Secondary | ICD-10-CM | POA: Insufficient documentation

## 2019-02-25 DIAGNOSIS — Y92007 Garden or yard of unspecified non-institutional (private) residence as the place of occurrence of the external cause: Secondary | ICD-10-CM | POA: Insufficient documentation

## 2019-02-25 DIAGNOSIS — S3992XA Unspecified injury of lower back, initial encounter: Secondary | ICD-10-CM | POA: Diagnosis present

## 2019-02-25 DIAGNOSIS — Y33XXXA Other specified events, undetermined intent, initial encounter: Secondary | ICD-10-CM | POA: Diagnosis not present

## 2019-02-25 DIAGNOSIS — Z7984 Long term (current) use of oral hypoglycemic drugs: Secondary | ICD-10-CM | POA: Diagnosis not present

## 2019-02-25 DIAGNOSIS — T542X1A Toxic effect of corrosive acids and acid-like substances, accidental (unintentional), initial encounter: Secondary | ICD-10-CM | POA: Insufficient documentation

## 2019-02-25 DIAGNOSIS — T304 Corrosion of unspecified body region, unspecified degree: Secondary | ICD-10-CM

## 2019-02-25 DIAGNOSIS — Z7982 Long term (current) use of aspirin: Secondary | ICD-10-CM | POA: Diagnosis not present

## 2019-02-25 DIAGNOSIS — Y999 Unspecified external cause status: Secondary | ICD-10-CM | POA: Diagnosis not present

## 2019-02-25 DIAGNOSIS — T2145XA Corrosion of unspecified degree of buttock, initial encounter: Secondary | ICD-10-CM | POA: Insufficient documentation

## 2019-02-25 DIAGNOSIS — Y93H9 Activity, other involving exterior property and land maintenance, building and construction: Secondary | ICD-10-CM | POA: Diagnosis not present

## 2019-02-25 DIAGNOSIS — E119 Type 2 diabetes mellitus without complications: Secondary | ICD-10-CM | POA: Diagnosis not present

## 2019-02-25 HISTORY — DX: Diverticulitis of intestine, part unspecified, without perforation or abscess without bleeding: K57.92

## 2019-02-25 MED ORDER — BACITRACIN ZINC 500 UNIT/GM EX OINT: 1.0000 "application " | TOPICAL_OINTMENT | Freq: Two times a day (BID) | CUTANEOUS | 0 refills | Status: AC

## 2019-02-25 MED ORDER — BACITRACIN ZINC 500 UNIT/GM EX OINT
TOPICAL_OINTMENT | Freq: Once | CUTANEOUS | Status: AC
  Administered 2019-02-25: 1 via TOPICAL
  Filled 2019-02-25: qty 28.35

## 2019-02-25 NOTE — ED Notes (Signed)
ED Provider at bedside. 
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
## 2019-02-25 NOTE — ED Provider Notes (Signed)
[facility] EMERGENCY DEPARTMENT Provider Note   CSN: [id number] Arrival date & time: 02/25/19  1951    History   Chief Complaint Chief Complaint  Patient presents with  . Burn    HPI Russell [name] is a 58 y.o. male.     HPI  58 year old male presents with left buttock burn.  He states that 6 days ago he was using chemicals to clean his pond like he often does.  At one point the hose became disconnected and it sprayed over the left side of his body and mostly back.  Did not think too much of it and an hour later he took a shower after cleaning.  Noticed a wound/burn to his left buttocks.  Wife looked at the wound again today and it was much worse so he came here for a wound check.  Has had blistering that appears to have ruptured.  He has noticed a little bit of drainage in his underwear but no color besides clear.  No fevers or pain in the area. Has tried some neosporin.  Past Medical History:  Diagnosis Date  . Abnormality of gait and mobility   . Arthritis   . Diabetes mellitus without complication (HCC)   . Diverticulitis   . History of kidney stones   . Indigestion   . Pneumonia    history of times one - 2010  . PONV (postoperative nausea and vomiting)   . Wears glasses     Patient Active Problem List   Diagnosis Date Noted  . Diverticulitis 11/29/2018  . Acute diverticulitis   . Newly diagnosed diabetes (HCC) 12/09/2017  . Angina pectoris (HCC) 01/28/2017  . S/P bilateral UKR 10/31/2015    Past Surgical History:  Procedure Laterality Date  . ACL bilat knee     repair   . bilat achilles replacement     . BILATERAL KNEE ARTHROSCOPY    . CERVICAL SPINE SURGERY     C6 secondary to trauma   . HERNIA REPAIR     inguinal - left   . LEFT HEART CATH AND CORONARY ANGIOGRAPHY N/A 01/28/2017   Procedure: Left Heart Cath and Coronary Angiography;  Surgeon: Lyn Records, MD;  Location: [facility] INVASIVE CV LAB;  Service: Cardiovascular;  Laterality: N/A;  .  MEDIAL COLLATERAL LIGAMENT REPAIR, KNEE     bilat   . PARTIAL KNEE ARTHROPLASTY Bilateral 10/31/2015   Procedure: UNICOMPARTMENTAL KNEE BILATERAL MEDIALLY;  Surgeon: Durene Romans, MD;  Location: [facility] ORS;  Service: Orthopedics;  Laterality: Bilateral;  . ROTATOR CUFF REPAIR     right shoulder         Home Medications    Prior to Admission medications   Medication Sig Start Date End Date Taking? Authorizing Provider  acetaminophen (TYLENOL) 325 MG tablet Take 2 tablets (650 mg total) by mouth every 6 (six) hours as needed for mild pain (or Fever >/= 101). 12/02/18   Elgergawy, Leana Roe, MD  acidophilus (RISAQUAD) CAPS capsule Take 1 capsule by mouth 3 (three) times daily. 12/02/18   Elgergawy, Leana Roe, MD  aspirin EC 81 MG tablet Take 81 mg by mouth daily.    [provider]  bacitracin ointment Apply 1 application topically 2 (two) times daily. 02/25/19   Pricilla Loveless, MD  calcium carbonate (TUMS - DOSED IN MG ELEMENTAL CALCIUM) 500 MG chewable tablet Chew 3 tablets by mouth at bedtime.    [provider]  clomiPHENE (CLOMID) 50 MG tablet Take 25 mg by  mouth daily.    [provider]  glimepiride (AMARYL) 1 MG tablet Take 1 mg by mouth daily with breakfast.    [provider]  metFORMIN (GLUCOPHAGE) 500 MG tablet Take by mouth 2 (two) times daily with a meal.    [provider]    Family History Family History  Problem Relation Age of Onset  . Heart attack Father   . Lung cancer Father   . Heart disease Mother   . Pancreatic cancer Mother     Social History Social History   Tobacco Use  . Smoking status: Never Smoker  . Smokeless tobacco: Never Used  Substance Use Topics  . Alcohol use: Yes    Comment: rarely   . Drug use: No     Allergies   Patient has no known allergies.   Review of Systems Review of Systems  Constitutional: Negative for fever.  Skin: Positive for color change and wound.     Physical Exam Updated  Vital Signs BP (!) 145/85 (BP Location: Right Arm)   Pulse 67   Temp 98.2 F (36.8 C) (Oral)   Resp 18   Ht 6' (1.829 m)   Wt 129.3 kg   SpO2 100%   BMI 38.65 kg/m   Physical Exam Vitals signs and nursing note reviewed.  Constitutional:      Appearance: He is well-developed.  HENT:     Head: Normocephalic and atraumatic.     Right Ear: External ear normal.     Left Ear: External ear normal.     Nose: Nose normal.  Eyes:     General:        Right eye: No discharge.        Left eye: No discharge.  Neck:     Musculoskeletal: Neck supple.  Pulmonary:     Effort: Pulmonary effort is normal.  Skin:    General: Skin is warm and dry.     Findings: Burn present.       Neurological:     Mental Status: He is alert.  Psychiatric:        Mood and Affect: Mood is not anxious.      ED Treatments / Results  Labs (all labs ordered are listed, but only abnormal results are displayed) Labs Reviewed - No data to display  EKG None  Radiology No results found.  Procedures Procedures (including critical care time)  Medications Ordered in ED Medications  bacitracin ointment (has no administration in time range)     Initial Impression / Assessment and Plan / ED Course  I have reviewed the triage vital signs and the nursing notes.  Pertinent labs & imaging results that were available during my care of the patient were reviewed by me and considered in my medical decision making (see chart for details).        Patient appears of suffered a chemical burn to his left buttocks.  He is not systemically ill.  Given he is a diabetic I think covering with antibiotic ointment is a good idea, both for protection against bacterial illness as well as for wound care.  Otherwise, there are no intact blisters at this time and no debridement appears necessary as this appears fairly superficial.  Discussed wound care and return precautions.  Final Clinical Impressions(s) / ED Diagnoses    Final diagnoses:  Chemical burn    ED Discharge Orders         Ordered    bacitracin ointment  2 times daily     02/25/19 2013           Pricilla Loveless, MD 02/25/19 2018

## 2019-03-03 DIAGNOSIS — T304 Corrosion of unspecified body region, unspecified degree: Secondary | ICD-10-CM | POA: Diagnosis not present

## 2019-03-09 DIAGNOSIS — T300 Burn of unspecified body region, unspecified degree: Secondary | ICD-10-CM | POA: Diagnosis not present

## 2019-03-21 DIAGNOSIS — Z125 Encounter for screening for malignant neoplasm of prostate: Secondary | ICD-10-CM | POA: Diagnosis not present

## 2019-03-21 DIAGNOSIS — Z0000 Encounter for general adult medical examination without abnormal findings: Secondary | ICD-10-CM | POA: Diagnosis not present

## 2019-03-21 DIAGNOSIS — E782 Mixed hyperlipidemia: Secondary | ICD-10-CM | POA: Diagnosis not present

## 2019-03-21 DIAGNOSIS — T304 Corrosion of unspecified body region, unspecified degree: Secondary | ICD-10-CM | POA: Diagnosis not present

## 2019-03-21 DIAGNOSIS — E1169 Type 2 diabetes mellitus with other specified complication: Secondary | ICD-10-CM | POA: Diagnosis not present

## 2019-05-29 DIAGNOSIS — E1169 Type 2 diabetes mellitus with other specified complication: Secondary | ICD-10-CM | POA: Diagnosis not present

## 2019-05-29 DIAGNOSIS — M791 Myalgia, unspecified site: Secondary | ICD-10-CM | POA: Diagnosis not present

## 2019-05-29 DIAGNOSIS — S30860A Insect bite (nonvenomous) of lower back and pelvis, initial encounter: Secondary | ICD-10-CM | POA: Diagnosis not present

## 2019-05-30 DIAGNOSIS — S30860A Insect bite (nonvenomous) of lower back and pelvis, initial encounter: Secondary | ICD-10-CM | POA: Diagnosis not present

## 2019-05-30 DIAGNOSIS — M791 Myalgia, unspecified site: Secondary | ICD-10-CM | POA: Diagnosis not present

## 2019-06-02 DIAGNOSIS — M549 Dorsalgia, unspecified: Secondary | ICD-10-CM | POA: Diagnosis not present

## 2019-09-21 DIAGNOSIS — R0789 Other chest pain: Secondary | ICD-10-CM | POA: Diagnosis not present

## 2019-09-21 DIAGNOSIS — E1169 Type 2 diabetes mellitus with other specified complication: Secondary | ICD-10-CM | POA: Diagnosis not present

## 2019-09-22 DIAGNOSIS — H5203 Hypermetropia, bilateral: Secondary | ICD-10-CM | POA: Diagnosis not present

## 2019-09-22 DIAGNOSIS — H524 Presbyopia: Secondary | ICD-10-CM | POA: Diagnosis not present

## 2019-09-22 DIAGNOSIS — H52223 Regular astigmatism, bilateral: Secondary | ICD-10-CM | POA: Diagnosis not present

## 2019-09-22 DIAGNOSIS — Z135 Encounter for screening for eye and ear disorders: Secondary | ICD-10-CM | POA: Diagnosis not present

## 2019-10-06 ENCOUNTER — Other Ambulatory Visit: Payer: Self-pay

## 2019-10-06 DIAGNOSIS — Z20822 Contact with and (suspected) exposure to covid-19: Secondary | ICD-10-CM

## 2019-10-08 LAB — NOVEL CORONAVIRUS, NAA: SARS-CoV-2, NAA: NOT DETECTED

## 2019-10-11 DIAGNOSIS — E1169 Type 2 diabetes mellitus with other specified complication: Secondary | ICD-10-CM | POA: Diagnosis not present

## 2019-10-11 DIAGNOSIS — Z23 Encounter for immunization: Secondary | ICD-10-CM | POA: Diagnosis not present

## 2019-10-16 DIAGNOSIS — S63622A Sprain of interphalangeal joint of left thumb, initial encounter: Secondary | ICD-10-CM | POA: Diagnosis not present

## 2019-11-14 ENCOUNTER — Ambulatory Visit: Payer: BLUE CROSS/BLUE SHIELD | Admitting: Cardiology

## 2019-11-14 ENCOUNTER — Encounter: Payer: Self-pay | Admitting: Cardiology

## 2019-11-14 ENCOUNTER — Other Ambulatory Visit: Payer: Self-pay

## 2019-11-14 VITALS — BP 120/82 | HR 76 | Ht 72.0 in | Wt 292.0 lb

## 2019-11-14 DIAGNOSIS — E119 Type 2 diabetes mellitus without complications: Secondary | ICD-10-CM | POA: Diagnosis not present

## 2019-11-14 DIAGNOSIS — R079 Chest pain, unspecified: Secondary | ICD-10-CM

## 2019-11-14 DIAGNOSIS — I10 Essential (primary) hypertension: Secondary | ICD-10-CM

## 2019-11-14 DIAGNOSIS — R0602 Shortness of breath: Secondary | ICD-10-CM | POA: Diagnosis not present

## 2019-11-14 NOTE — Progress Notes (Signed)
Cardiology Office Note    Date:  11/14/2019   ID:  Russell [name], DOB 05-02-1961, MRN [id number]  PCP:  Daisy Floro, MD  Cardiologist:   Donato Schultz, MD   No chief complaint on file.   History of Present Illness:  Russell [name] is a 59 y.o. male here for follow-up of chest pain at the request of Dr. Duane Lope.   Strong family history: Father had 6 heart attacks, died at age 70. Mother with MI.    At last visit in 2018 had the following story: He has been experiencing chest discomfort when walking uphill over the past month, moderate in severity. Duration 15 minutes, associated shortness of breath, took aspirin and this resolved. Relieved with rest. He was on the [facility], Environmental manager. Had a pain like never before, ripping out of his chest. Could not get breath. Slow breaths stopped. Stipped down to T shirt, diaphoretic.   Takes baby ASA when he thinks about it.   Has some left arm pain at times, sometimes pain in chest at times.   Never smoked. Nondiabetic. Morbid obesity. He has been receiving testosterone.  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   Chol elevated. Lipitor about killed him he says.   11/14/2019 -Here for evaluation of chest pain shortness of breath.  Cardiac catheterization once again 01/28/2017 was normal.  Pain on left side of chest radiating down his left arm to mid arm.  Feels headaches at the same time. Stress.  Owns his own company.  On pravastatin.  Denies any fever chills.     Past Medical History:  Diagnosis Date  . Abnormality of gait and mobility   . Acute diverticulitis   . Angina pectoris (HCC) 01/28/2017  . Arthritis   . Diabetes mellitus without complication (HCC)   . Diverticulitis   . History of kidney stones   . Indigestion   . Newly diagnosed diabetes (HCC) 12/09/2017  . Pneumonia    history of times one - 2010  . PONV (postoperative nausea and vomiting)   . S/P bilateral UKR 10/31/2015  . Wears glasses      Past Surgical History:  Procedure Laterality Date  . ACL bilat knee     repair   . bilat achilles replacement     . BILATERAL KNEE ARTHROSCOPY    . CERVICAL SPINE SURGERY     C6 secondary to trauma   . HERNIA REPAIR     inguinal - left   . LEFT HEART CATH AND CORONARY ANGIOGRAPHY N/A 01/28/2017   Procedure: Left Heart Cath and Coronary Angiography;  Surgeon: Lyn Records, MD;  Location: [facility] INVASIVE CV LAB;  Service: Cardiovascular;  Laterality: N/A;  . MEDIAL COLLATERAL LIGAMENT REPAIR, KNEE     bilat   . PARTIAL KNEE ARTHROPLASTY Bilateral 10/31/2015   Procedure: UNICOMPARTMENTAL KNEE BILATERAL MEDIALLY;  Surgeon: Durene Romans, MD;  Location: [facility] ORS;  Service: Orthopedics;  Laterality: Bilateral;  . ROTATOR CUFF REPAIR     right shoulder     Current Medications: Outpatient Medications Prior to Visit  Medication Sig Dispense Refill  . acetaminophen (TYLENOL) 325 MG tablet Take 2 tablets (650 mg total) by mouth every 6 (six) hours as needed for mild pain (or Fever >/= 101).    Marland Kitchen acidophilus (RISAQUAD) CAPS capsule Take 1 capsule by mouth 3 (three) times daily. 30 capsule 0  . aspirin EC 81 MG tablet Take 81 mg by mouth daily.    Marland Kitchen  bacitracin ointment Apply 1 application topically 2 (two) times daily. 120 g 0  . calcium carbonate (TUMS - DOSED IN MG ELEMENTAL CALCIUM) 500 MG chewable tablet Chew 3 tablets by mouth at bedtime.    . clomiPHENE (CLOMID) 50 MG tablet Take 25 mg by mouth daily.    Marland Kitchen FARXIGA 10 MG TABS tablet Take 5 mg by mouth daily.    Marland Kitchen glimepiride (AMARYL) 1 MG tablet Take 1 mg by mouth daily with breakfast.    . metFORMIN (GLUCOPHAGE-XR) 500 MG 24 hr tablet Take 1,000 mg by mouth at bedtime.    . pravastatin (PRAVACHOL) 10 MG tablet Take 10 mg by mouth once a week.    . metFORMIN (GLUCOPHAGE) 500 MG tablet Take by mouth 2 (two) times daily with a meal.     No facility-administered medications prior to visit.     Allergies:   Patient has no known allergies.    Social History   Socioeconomic History  . Marital status: Married    Spouse name: Not on file  . Number of children: Not on file  . Years of education: Not on file  . Highest education level: Not on file  Occupational History  . Not on file  Tobacco Use  . Smoking status: Never Smoker  . Smokeless tobacco: Never Used  Substance and Sexual Activity  . Alcohol use: Yes    Comment: rarely   . Drug use: No  . Sexual activity: Not on file  Other Topics Concern  . Not on file  Social History Narrative  . Not on file   Social Determinants of Health   Financial Resource Strain:   . Difficulty of Paying Living Expenses: Not on file  Food Insecurity:   . Worried About Charity fundraiser in the Last Year: Not on file  . Ran Out of Food in the Last Year: Not on file  Transportation Needs:   . Lack of Transportation (Medical): Not on file  . Lack of Transportation (Non-Medical): Not on file  Physical Activity:   . Days of Exercise per Week: Not on file  . Minutes of Exercise per Session: Not on file  Stress:   . Feeling of Stress : Not on file  Social Connections:   . Frequency of Communication with Friends and Family: Not on file  . Frequency of Social Gatherings with Friends and Family: Not on file  . Attends Religious Services: Not on file  . Active Member of Clubs or Organizations: Not on file  . Attends Archivist Meetings: Not on file  . Marital Status: Not on file     Family History:  The patient's family history includes Heart attack in his father; Heart disease in his mother; Lung cancer in his father; Pancreatic cancer in his mother.   ROS:   Please see the history of present illness.    Review of Systems  All other systems reviewed and are negative.  All other systems reviewed and are negative.   PHYSICAL EXAM:   VS:  BP 120/82   Pulse 76   Ht 6' (1.829 m)   Wt 292 lb (132.5 kg)   SpO2 93%   BMI 39.60 kg/m    GEN: Well nourished, well  developed, in no acute distress  HEENT: normal  Neck: no JVD, carotid bruits, or masses Cardiac: RRR; no murmurs, rubs, or gallops,no edema  Respiratory:  clear to auscultation bilaterally, normal work of breathing GI: soft, nontender, nondistended, +  BS MS: no deformity or atrophy  Skin: warm and dry, no rash Neuro:  Alert and Oriented x 3, Strength and sensation are intact Psych: euthymic mood, full affect   Wt Readings from Last 3 Encounters:  11/14/19 292 lb (132.5 kg)  02/25/19 285 lb (129.3 kg)  11/29/18 290 lb (131.5 kg)      Studies/Labs Reviewed:   EKG:  EKG ordered today 01/26/17 normal sinus rhythm 81, borderline LVH with nonspecific ST-T wave changes, subtle T-wave inversion in aVF, T-wave inversion noted in 3. Personally viewed-prior 12/22/16-sinus rhythm with no other abnormalities other than borderline low voltage. Personally viewed.  Recent Labs: 11/29/2018: ALT 20 12/01/2018: Hemoglobin 14.2; Platelets 136 12/02/2018: BUN 13; Creatinine, Ser 0.84; Potassium 3.4; Sodium 140   White count 6.5, hemoglobin 17, platelets 171  Lipid Panel No results found for: CHOL, TRIG, HDL, CHOLHDL, VLDL, LDLCALC, LDLDIRECT  Additional studies/ records that were reviewed today include:  Prior office notes reviewed, EKG reviewed, lab work reviewed    ASSESSMENT:    1. Shortness of breath   2. Chest pain of uncertain etiology   3. Diabetes mellitus with coincident hypertension (HCC)      PLAN:  In order of problems listed above:  Chest pain/shortness of breath  -Heart catheterization 2018 was reassuring with no CAD.  Excellent.  I think it does make sense to check echocardiogram to ensure proper structure and function of his heart especially given his symptoms of shortness of breath.  Does not require any invasive therapy at this time. Strong family history of MI with his father dying in his 78s after multiple heart attacks.  -He could even consider discontinuing his aspirin  81 mg or perhaps minimizing the dosage by spreading out the interval as evidence of benefit and primary prevention is minimal.  This would help reduce bleeding risk.  Discussed with him.  PVC on ECG is benign.  EKG today benign.  Hyperlipidemia  - Encourage use of statin, continue on pravastatin.  Had trouble in the past with other statins.  May consider trial of rosuvastatin, higher intensity statin, often better tolerated as well.  LDL from outside source was 101, HDL 40, hemoglobin A1c 9.9, hemoglobin 14.2, 2020   Morbid obesity  - Continue to encourage weight loss.  BMI greater than 35 with 2 or more comorbidities.    Medication Adjustments/Labs and Tests Ordered: Current medicines are reviewed at length with the patient today.  Concerns regarding medicines are outlined above.  Medication changes, Labs and Tests ordered today are listed in the Patient Instructions below. Patient Instructions  Medication Instructions:  The current medical regimen is effective;  continue present plan and medications.  *If you need a refill on your cardiac medications before your next appointment, please call your pharmacy*  Testing/Procedures: Your physician has requested that you have an echocardiogram. Echocardiography is a painless test that uses sound waves to create images of your heart. It provides your doctor with information about the size and shape of your heart and how well your heart's chambers and valves are working. This procedure takes approximately one hour. There are no restrictions for this procedure.  Follow-Up: Follow up as needed after the above testing.  Thank you for choosing [facility]!!        Signed, Donato Schultz, MD  11/14/2019 4:21 PM    [facility] [address] [place], [place], [place]  [address] Phone: [phone]; Fax: [phone]

## 2019-11-14 NOTE — Patient Instructions (Signed)
Medication Instructions:  The current medical regimen is effective;  continue present plan and medications.  *If you need a refill on your cardiac medications before your next appointment, please call your pharmacy*  Testing/Procedures: Your physician has requested that you have an echocardiogram. Echocardiography is a painless test that uses sound waves to create images of your heart. It provides your doctor with information about the size and shape of your heart and how well your heart's chambers and valves are working. This procedure takes approximately one hour. There are no restrictions for this procedure.  Follow-Up: Follow up as needed after the above testing.  Thank you for choosing [facility]!!     

## 2019-11-22 ENCOUNTER — Other Ambulatory Visit: Payer: Self-pay

## 2019-11-22 ENCOUNTER — Ambulatory Visit (HOSPITAL_COMMUNITY): Payer: BLUE CROSS/BLUE SHIELD | Attending: Cardiovascular Disease

## 2019-11-22 ENCOUNTER — Telehealth: Payer: Self-pay

## 2019-11-22 DIAGNOSIS — R0602 Shortness of breath: Secondary | ICD-10-CM

## 2019-11-22 DIAGNOSIS — R079 Chest pain, unspecified: Secondary | ICD-10-CM | POA: Insufficient documentation

## 2019-11-22 NOTE — Telephone Encounter (Signed)
-----   Message from Jake Bathe, MD sent at 11/22/2019  3:53 PM EST ----- Normal ejection fraction, pump function. Overall reassuring echocardiogram. Donato Schultz, MD

## 2019-11-22 NOTE — Telephone Encounter (Signed)
The patient has been notified of the Echo result and verbalized understanding.  All questions (if any) were answered. Sigurd Sos, RN 11/22/2019 4:21 PM

## 2019-12-30 DIAGNOSIS — E118 Type 2 diabetes mellitus with unspecified complications: Secondary | ICD-10-CM | POA: Diagnosis not present

## 2020-02-04 IMAGING — CT CT ABD-PELV W/ CM
2 of 5 series · 16 of 46 positions shown, 18 images · IV contrast (APPLIED)
Comparison: 10/07/2018

CLINICAL DATA: Left-sided abdominal pain over the last 3 days.
History of diverticulitis.

EXAM:
CT ABDOMEN AND PELVIS WITH CONTRAST
TECHNIQUE: Multidetector CT imaging of the abdomen and pelvis was performed
using the standard protocol following bolus administration of
intravenous contrast.
CONTRAST:  100mL [id number] IOPAMIDOL ([id number]-[id number]) INJECTION 61%

[Series 2: axial st · axial · 0.98mm/px · z∈[-573,-108]mm · 13 of 105 slices shown, 15 images]
[im 6/105  soft-tissue]
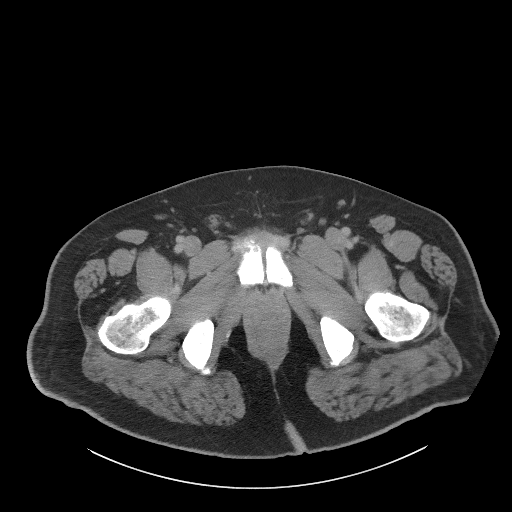
[im 6/105  bone]
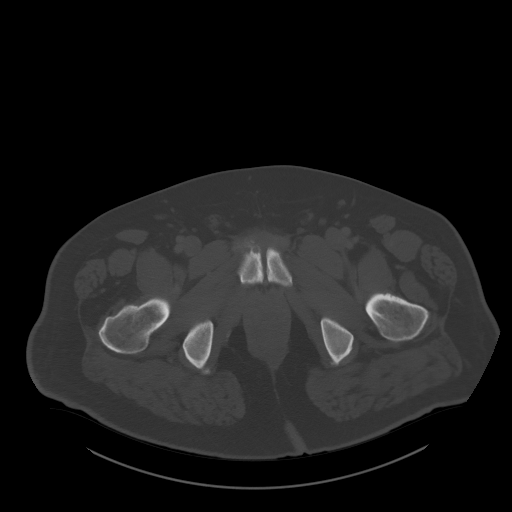
[im 12/105  soft-tissue]
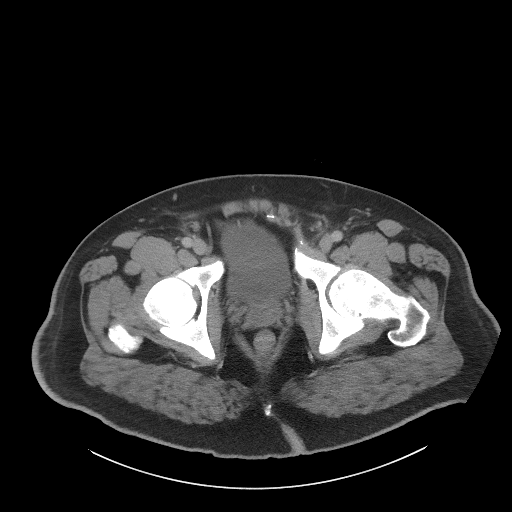
[im 24/105  soft-tissue]
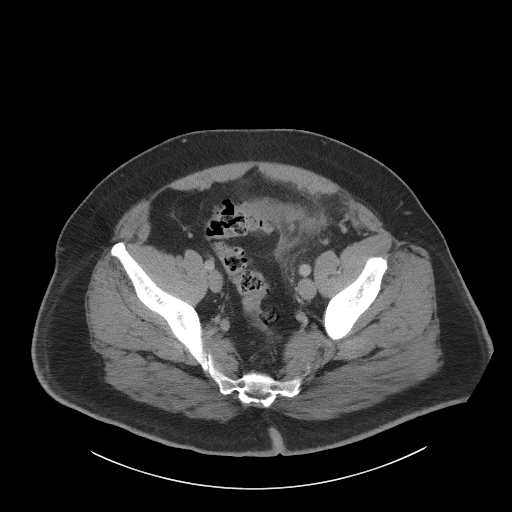
[im 29/105  soft-tissue]
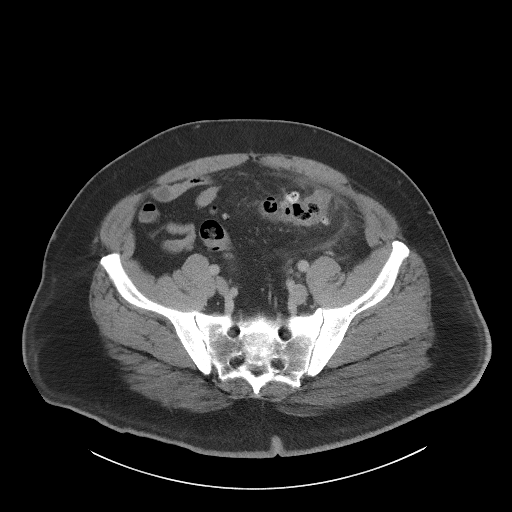
[im 35/105  soft-tissue]
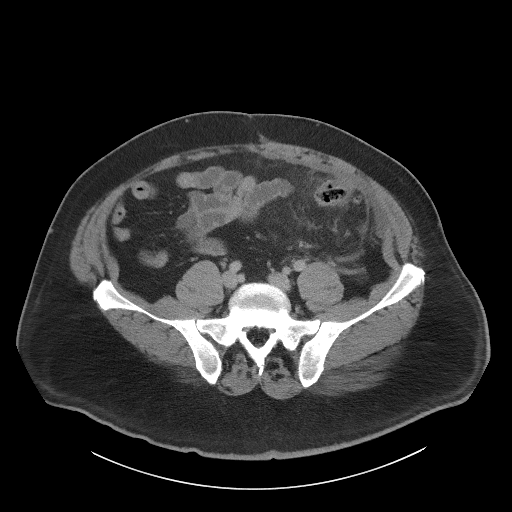
[im 47/105  soft-tissue]
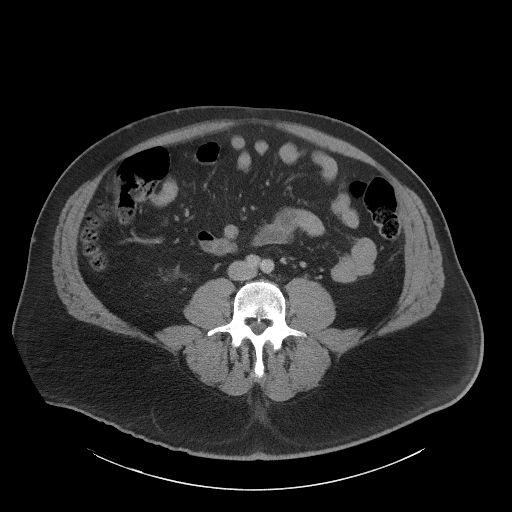
[im 53/105  soft-tissue]
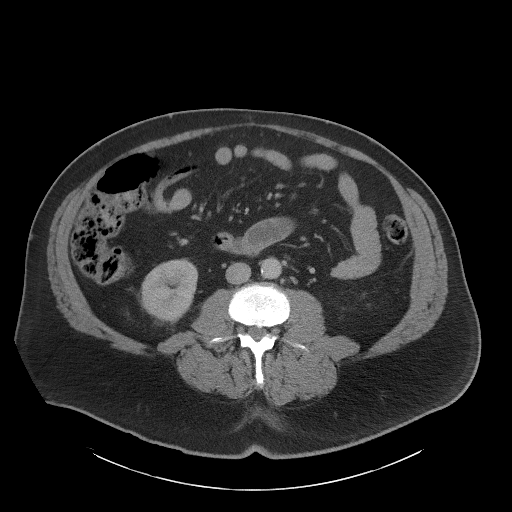
[im 58/105  soft-tissue]
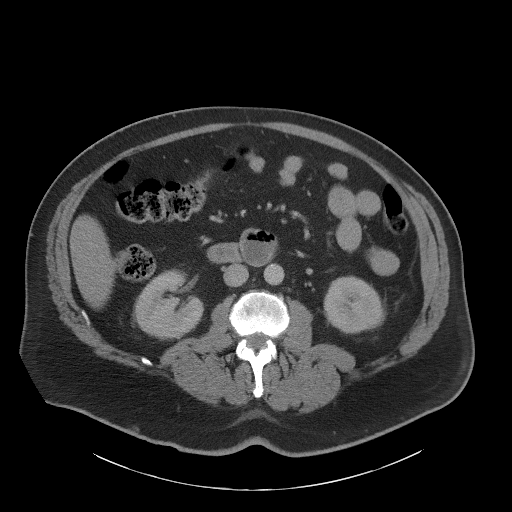
[im 70/105  soft-tissue]
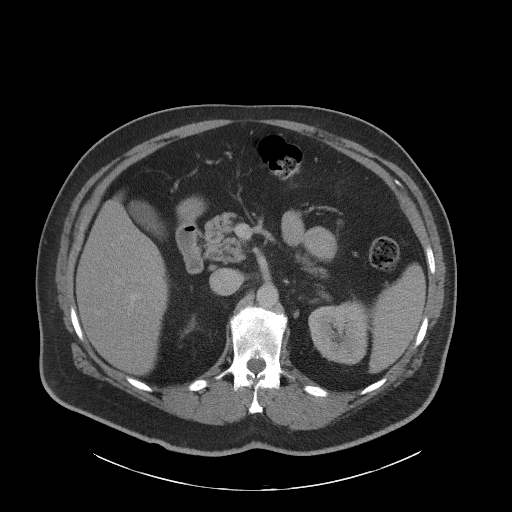
[im 70/105  bone]
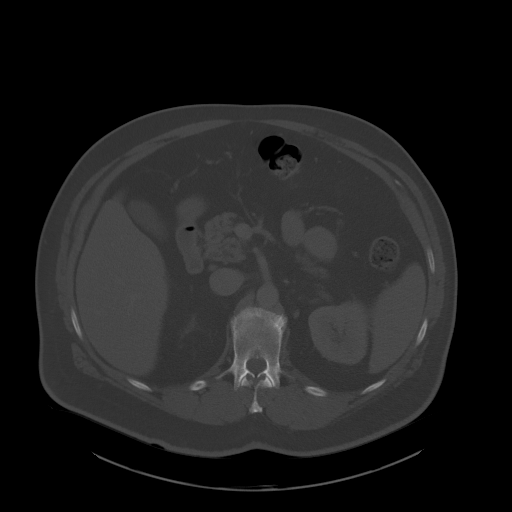
[im 76/105  soft-tissue]
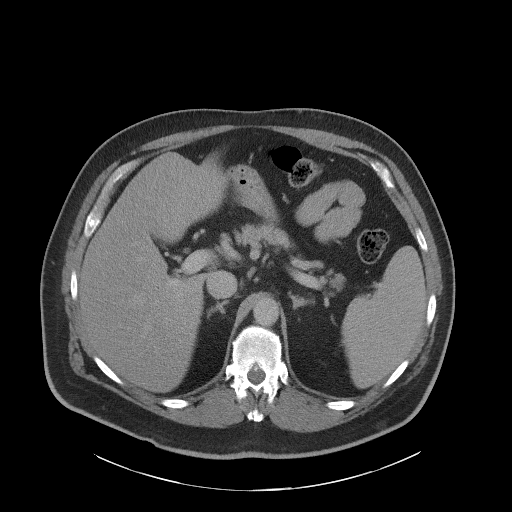
[im 81/105  soft-tissue]
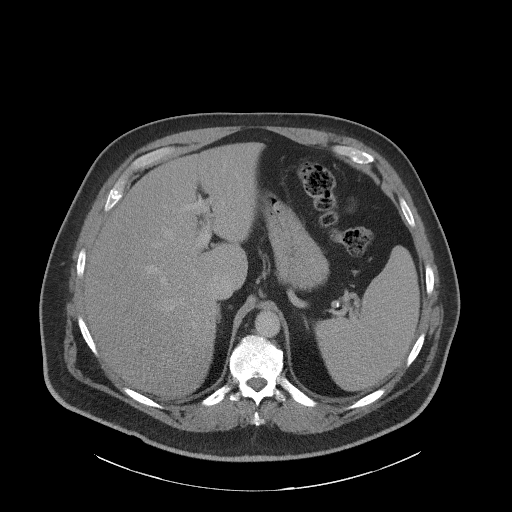
[im 93/105  soft-tissue]
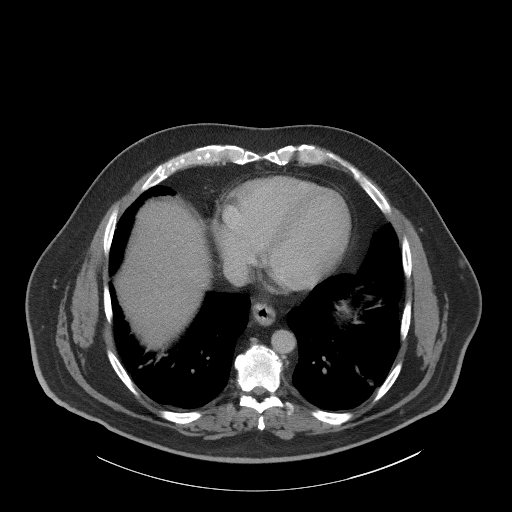
[im 99/105  soft-tissue]
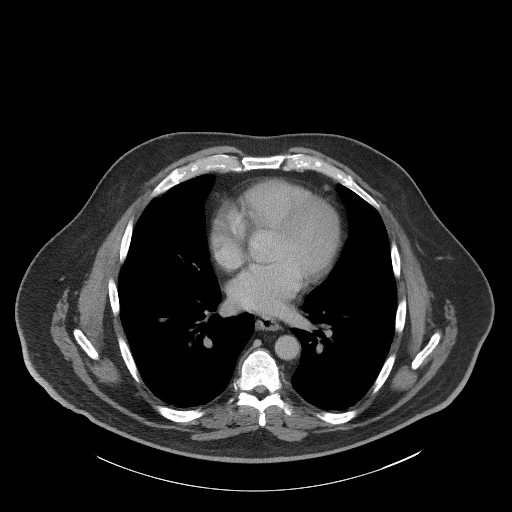

[Series 5: coronal st · coronal · 0.96mm/px · 3 of 104 slices shown]
[im 35/104  soft-tissue]
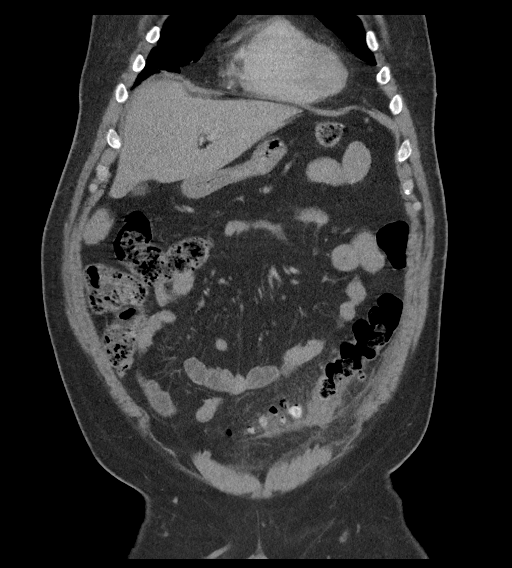
[im 46/104  soft-tissue]
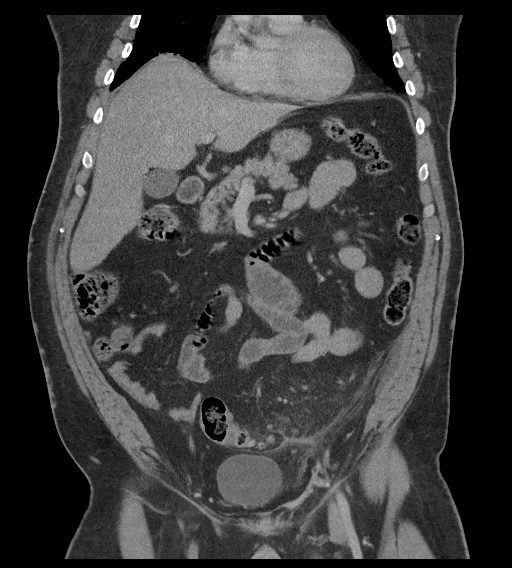
[im 58/104  soft-tissue]
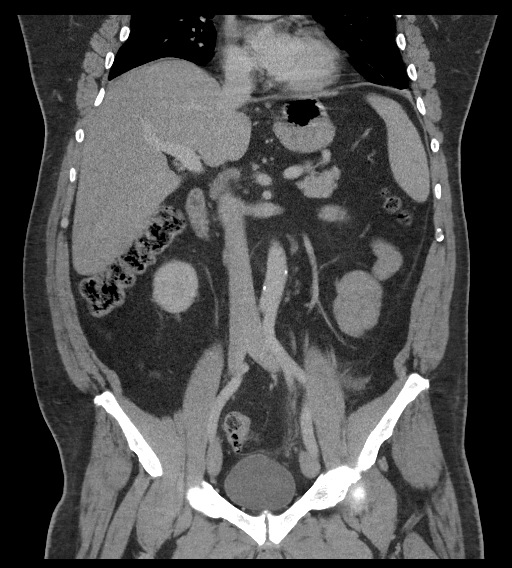

[16 of 46 positions shown; findings below may reference images not displayed]

FINDINGS: Lower chest: Mild linear atelectasis in both lower lobes.

Hepatobiliary: No liver parenchymal lesion. Small gallstones
dependent in the gallbladder as seen previously. No CT evidence of
cholecystitis or obstruction.

Pancreas: Normal

Spleen: Normal

Adrenals/Urinary Tract: Adrenal glands are normal. Kidneys are
normal except for a simple cyst on the right. Bladder is normal.

Stomach/Bowel: Stomach and small bowel appear normal. Persistent and
slightly worsened changes of sigmoid diverticulitis. The amount of
stranding of the regional fat is increased. Small area of
phlegmonous inflammation within the fat without discretely
identified abscess.

Vascular/Lymphatic: Aortic atherosclerosis. No aneurysm. IVC is
normal. No retroperitoneal adenopathy.

Reproductive: Normal

Other: No free fluid or air. Previous left inguinal hernia repair.

Musculoskeletal: Ordinary lower lumbar degenerative changes.
IMPRESSION: Persistent and slightly worsened changes of sigmoid diverticulitis.
The amount of stranding of the regional fat is increased. Small area
of phlegmonous inflammation within the fat without discretely
identified abscess.

## 2020-02-15 DIAGNOSIS — M5412 Radiculopathy, cervical region: Secondary | ICD-10-CM | POA: Diagnosis not present

## 2020-02-19 DIAGNOSIS — M50321 Other cervical disc degeneration at C4-C5 level: Secondary | ICD-10-CM | POA: Diagnosis not present

## 2020-02-19 DIAGNOSIS — M9901 Segmental and somatic dysfunction of cervical region: Secondary | ICD-10-CM | POA: Diagnosis not present

## 2020-02-19 DIAGNOSIS — M542 Cervicalgia: Secondary | ICD-10-CM | POA: Diagnosis not present

## 2020-02-19 DIAGNOSIS — M50121 Cervical disc disorder at C4-C5 level with radiculopathy: Secondary | ICD-10-CM | POA: Diagnosis not present

## 2020-02-20 DIAGNOSIS — M542 Cervicalgia: Secondary | ICD-10-CM | POA: Diagnosis not present

## 2020-02-20 DIAGNOSIS — M9901 Segmental and somatic dysfunction of cervical region: Secondary | ICD-10-CM | POA: Diagnosis not present

## 2020-02-20 DIAGNOSIS — M50121 Cervical disc disorder at C4-C5 level with radiculopathy: Secondary | ICD-10-CM | POA: Diagnosis not present

## 2020-02-20 DIAGNOSIS — M50321 Other cervical disc degeneration at C4-C5 level: Secondary | ICD-10-CM | POA: Diagnosis not present

## 2020-02-21 DIAGNOSIS — M50321 Other cervical disc degeneration at C4-C5 level: Secondary | ICD-10-CM | POA: Diagnosis not present

## 2020-02-21 DIAGNOSIS — M542 Cervicalgia: Secondary | ICD-10-CM | POA: Diagnosis not present

## 2020-02-21 DIAGNOSIS — M50121 Cervical disc disorder at C4-C5 level with radiculopathy: Secondary | ICD-10-CM | POA: Diagnosis not present

## 2020-02-21 DIAGNOSIS — M9901 Segmental and somatic dysfunction of cervical region: Secondary | ICD-10-CM | POA: Diagnosis not present

## 2020-02-22 DIAGNOSIS — M542 Cervicalgia: Secondary | ICD-10-CM | POA: Diagnosis not present

## 2020-02-22 DIAGNOSIS — M9901 Segmental and somatic dysfunction of cervical region: Secondary | ICD-10-CM | POA: Diagnosis not present

## 2020-02-22 DIAGNOSIS — M50321 Other cervical disc degeneration at C4-C5 level: Secondary | ICD-10-CM | POA: Diagnosis not present

## 2020-02-22 DIAGNOSIS — M50121 Cervical disc disorder at C4-C5 level with radiculopathy: Secondary | ICD-10-CM | POA: Diagnosis not present

## 2020-02-26 DIAGNOSIS — M50321 Other cervical disc degeneration at C4-C5 level: Secondary | ICD-10-CM | POA: Diagnosis not present

## 2020-02-26 DIAGNOSIS — M9901 Segmental and somatic dysfunction of cervical region: Secondary | ICD-10-CM | POA: Diagnosis not present

## 2020-02-26 DIAGNOSIS — M50121 Cervical disc disorder at C4-C5 level with radiculopathy: Secondary | ICD-10-CM | POA: Diagnosis not present

## 2020-02-26 DIAGNOSIS — M542 Cervicalgia: Secondary | ICD-10-CM | POA: Diagnosis not present

## 2020-02-27 DIAGNOSIS — M9901 Segmental and somatic dysfunction of cervical region: Secondary | ICD-10-CM | POA: Diagnosis not present

## 2020-02-27 DIAGNOSIS — M50321 Other cervical disc degeneration at C4-C5 level: Secondary | ICD-10-CM | POA: Diagnosis not present

## 2020-02-27 DIAGNOSIS — M50121 Cervical disc disorder at C4-C5 level with radiculopathy: Secondary | ICD-10-CM | POA: Diagnosis not present

## 2020-02-27 DIAGNOSIS — M542 Cervicalgia: Secondary | ICD-10-CM | POA: Diagnosis not present

## 2020-02-28 DIAGNOSIS — E118 Type 2 diabetes mellitus with unspecified complications: Secondary | ICD-10-CM | POA: Diagnosis not present

## 2020-02-29 DIAGNOSIS — M9901 Segmental and somatic dysfunction of cervical region: Secondary | ICD-10-CM | POA: Diagnosis not present

## 2020-02-29 DIAGNOSIS — M50121 Cervical disc disorder at C4-C5 level with radiculopathy: Secondary | ICD-10-CM | POA: Diagnosis not present

## 2020-02-29 DIAGNOSIS — M542 Cervicalgia: Secondary | ICD-10-CM | POA: Diagnosis not present

## 2020-02-29 DIAGNOSIS — M50321 Other cervical disc degeneration at C4-C5 level: Secondary | ICD-10-CM | POA: Diagnosis not present

## 2020-03-04 DIAGNOSIS — M9901 Segmental and somatic dysfunction of cervical region: Secondary | ICD-10-CM | POA: Diagnosis not present

## 2020-03-04 DIAGNOSIS — M50121 Cervical disc disorder at C4-C5 level with radiculopathy: Secondary | ICD-10-CM | POA: Diagnosis not present

## 2020-03-04 DIAGNOSIS — M542 Cervicalgia: Secondary | ICD-10-CM | POA: Diagnosis not present

## 2020-03-04 DIAGNOSIS — M50321 Other cervical disc degeneration at C4-C5 level: Secondary | ICD-10-CM | POA: Diagnosis not present

## 2020-03-05 DIAGNOSIS — M50321 Other cervical disc degeneration at C4-C5 level: Secondary | ICD-10-CM | POA: Diagnosis not present

## 2020-03-05 DIAGNOSIS — M542 Cervicalgia: Secondary | ICD-10-CM | POA: Diagnosis not present

## 2020-03-05 DIAGNOSIS — M50121 Cervical disc disorder at C4-C5 level with radiculopathy: Secondary | ICD-10-CM | POA: Diagnosis not present

## 2020-03-05 DIAGNOSIS — M9901 Segmental and somatic dysfunction of cervical region: Secondary | ICD-10-CM | POA: Diagnosis not present

## 2020-03-07 DIAGNOSIS — M50121 Cervical disc disorder at C4-C5 level with radiculopathy: Secondary | ICD-10-CM | POA: Diagnosis not present

## 2020-03-07 DIAGNOSIS — M542 Cervicalgia: Secondary | ICD-10-CM | POA: Diagnosis not present

## 2020-03-07 DIAGNOSIS — M9901 Segmental and somatic dysfunction of cervical region: Secondary | ICD-10-CM | POA: Diagnosis not present

## 2020-03-07 DIAGNOSIS — M50321 Other cervical disc degeneration at C4-C5 level: Secondary | ICD-10-CM | POA: Diagnosis not present

## 2020-03-11 DIAGNOSIS — M50321 Other cervical disc degeneration at C4-C5 level: Secondary | ICD-10-CM | POA: Diagnosis not present

## 2020-03-11 DIAGNOSIS — M50121 Cervical disc disorder at C4-C5 level with radiculopathy: Secondary | ICD-10-CM | POA: Diagnosis not present

## 2020-03-11 DIAGNOSIS — M542 Cervicalgia: Secondary | ICD-10-CM | POA: Diagnosis not present

## 2020-03-11 DIAGNOSIS — M9901 Segmental and somatic dysfunction of cervical region: Secondary | ICD-10-CM | POA: Diagnosis not present

## 2020-03-13 DIAGNOSIS — M50321 Other cervical disc degeneration at C4-C5 level: Secondary | ICD-10-CM | POA: Diagnosis not present

## 2020-03-13 DIAGNOSIS — M50121 Cervical disc disorder at C4-C5 level with radiculopathy: Secondary | ICD-10-CM | POA: Diagnosis not present

## 2020-03-13 DIAGNOSIS — M542 Cervicalgia: Secondary | ICD-10-CM | POA: Diagnosis not present

## 2020-03-13 DIAGNOSIS — M9901 Segmental and somatic dysfunction of cervical region: Secondary | ICD-10-CM | POA: Diagnosis not present

## 2020-03-18 DIAGNOSIS — M50321 Other cervical disc degeneration at C4-C5 level: Secondary | ICD-10-CM | POA: Diagnosis not present

## 2020-03-18 DIAGNOSIS — M9901 Segmental and somatic dysfunction of cervical region: Secondary | ICD-10-CM | POA: Diagnosis not present

## 2020-03-18 DIAGNOSIS — M50121 Cervical disc disorder at C4-C5 level with radiculopathy: Secondary | ICD-10-CM | POA: Diagnosis not present

## 2020-03-18 DIAGNOSIS — M542 Cervicalgia: Secondary | ICD-10-CM | POA: Diagnosis not present

## 2020-03-19 DIAGNOSIS — E1169 Type 2 diabetes mellitus with other specified complication: Secondary | ICD-10-CM | POA: Diagnosis not present

## 2020-03-19 DIAGNOSIS — E782 Mixed hyperlipidemia: Secondary | ICD-10-CM | POA: Diagnosis not present

## 2020-03-19 DIAGNOSIS — Z125 Encounter for screening for malignant neoplasm of prostate: Secondary | ICD-10-CM | POA: Diagnosis not present

## 2020-03-19 DIAGNOSIS — Z0000 Encounter for general adult medical examination without abnormal findings: Secondary | ICD-10-CM | POA: Diagnosis not present

## 2020-03-20 DIAGNOSIS — M50121 Cervical disc disorder at C4-C5 level with radiculopathy: Secondary | ICD-10-CM | POA: Diagnosis not present

## 2020-03-20 DIAGNOSIS — M9901 Segmental and somatic dysfunction of cervical region: Secondary | ICD-10-CM | POA: Diagnosis not present

## 2020-03-20 DIAGNOSIS — M542 Cervicalgia: Secondary | ICD-10-CM | POA: Diagnosis not present

## 2020-03-20 DIAGNOSIS — M50321 Other cervical disc degeneration at C4-C5 level: Secondary | ICD-10-CM | POA: Diagnosis not present

## 2020-03-21 DIAGNOSIS — Z0000 Encounter for general adult medical examination without abnormal findings: Secondary | ICD-10-CM | POA: Diagnosis not present

## 2020-03-29 DIAGNOSIS — E118 Type 2 diabetes mellitus with unspecified complications: Secondary | ICD-10-CM | POA: Diagnosis not present

## 2020-08-28 DIAGNOSIS — M7061 Trochanteric bursitis, right hip: Secondary | ICD-10-CM | POA: Diagnosis not present

## 2020-08-28 DIAGNOSIS — Z96653 Presence of artificial knee joint, bilateral: Secondary | ICD-10-CM | POA: Diagnosis not present

## 2020-08-28 DIAGNOSIS — M25551 Pain in right hip: Secondary | ICD-10-CM | POA: Diagnosis not present

## 2020-08-28 DIAGNOSIS — M25562 Pain in left knee: Secondary | ICD-10-CM | POA: Diagnosis not present

## 2020-08-28 DIAGNOSIS — M25561 Pain in right knee: Secondary | ICD-10-CM | POA: Diagnosis not present

## 2020-10-04 ENCOUNTER — Ambulatory Visit: Payer: BLUE CROSS/BLUE SHIELD | Attending: Internal Medicine

## 2020-10-04 DIAGNOSIS — Z23 Encounter for immunization: Secondary | ICD-10-CM

## 2020-10-04 NOTE — Progress Notes (Signed)
   Covid-19 Vaccination Clinic  Name:  Russell [name]    MRN: [id number] DOB: 11-30-1960  10/04/2020  Mr. Shaff was observed post Covid-19 immunization for 30 minutes based on pre-vaccination screening without incident. He was provided with Vaccine Information Sheet and instruction to access the V-Safe system.   Mr. Deasis was instructed to call 911 with any severe reactions post vaccine: Marland Kitchen Difficulty breathing  . Swelling of face and throat  . A fast heartbeat  . A bad rash all over body  . Dizziness and weakness   Immunizations Administered    Name Date Dose VIS Date Route   Pfizer COVID-19 Vaccine 10/04/2020  2:06 PM 0.3 mL 08/21/2020 Intramuscular   Manufacturer: [employer], [employer]   Lot: [id number]   NDC: [address]-9

## 2020-10-11 DIAGNOSIS — M25562 Pain in left knee: Secondary | ICD-10-CM | POA: Diagnosis not present

## 2020-10-11 DIAGNOSIS — Z96653 Presence of artificial knee joint, bilateral: Secondary | ICD-10-CM | POA: Diagnosis not present

## 2020-10-11 DIAGNOSIS — M25561 Pain in right knee: Secondary | ICD-10-CM | POA: Diagnosis not present

## 2020-10-11 DIAGNOSIS — M25551 Pain in right hip: Secondary | ICD-10-CM | POA: Diagnosis not present

## 2020-10-24 DIAGNOSIS — M25551 Pain in right hip: Secondary | ICD-10-CM | POA: Diagnosis not present

## 2020-11-14 DIAGNOSIS — M25551 Pain in right hip: Secondary | ICD-10-CM | POA: Diagnosis not present

## 2020-11-16 DIAGNOSIS — M25551 Pain in right hip: Secondary | ICD-10-CM | POA: Diagnosis not present

## 2020-12-23 DIAGNOSIS — J019 Acute sinusitis, unspecified: Secondary | ICD-10-CM | POA: Diagnosis not present

## 2020-12-29 DIAGNOSIS — E118 Type 2 diabetes mellitus with unspecified complications: Secondary | ICD-10-CM | POA: Diagnosis not present

## 2021-04-01 DIAGNOSIS — E782 Mixed hyperlipidemia: Secondary | ICD-10-CM | POA: Diagnosis not present

## 2021-04-01 DIAGNOSIS — E1169 Type 2 diabetes mellitus with other specified complication: Secondary | ICD-10-CM | POA: Diagnosis not present

## 2021-04-01 DIAGNOSIS — Z0000 Encounter for general adult medical examination without abnormal findings: Secondary | ICD-10-CM | POA: Diagnosis not present

## 2021-04-01 DIAGNOSIS — Z13220 Encounter for screening for lipoid disorders: Secondary | ICD-10-CM | POA: Diagnosis not present

## 2021-04-01 DIAGNOSIS — Z125 Encounter for screening for malignant neoplasm of prostate: Secondary | ICD-10-CM | POA: Diagnosis not present

## 2021-04-03 DIAGNOSIS — E782 Mixed hyperlipidemia: Secondary | ICD-10-CM | POA: Diagnosis not present

## 2021-04-03 DIAGNOSIS — R61 Generalized hyperhidrosis: Secondary | ICD-10-CM | POA: Diagnosis not present

## 2021-04-03 DIAGNOSIS — Z0000 Encounter for general adult medical examination without abnormal findings: Secondary | ICD-10-CM | POA: Diagnosis not present

## 2021-04-03 DIAGNOSIS — E1169 Type 2 diabetes mellitus with other specified complication: Secondary | ICD-10-CM | POA: Diagnosis not present

## 2021-04-03 DIAGNOSIS — M5412 Radiculopathy, cervical region: Secondary | ICD-10-CM | POA: Diagnosis not present

## 2021-04-04 ENCOUNTER — Other Ambulatory Visit: Payer: Self-pay | Admitting: Surgery

## 2021-04-04 DIAGNOSIS — M5412 Radiculopathy, cervical region: Secondary | ICD-10-CM

## 2021-04-05 ENCOUNTER — Ambulatory Visit
Admission: RE | Admit: 2021-04-05 | Discharge: 2021-04-05 | Disposition: A | Discharge status: Home / Self Care | Payer: BLUE CROSS/BLUE SHIELD | Source: Ambulatory Visit | Attending: Surgery | Admitting: Surgery

## 2021-04-05 ENCOUNTER — Other Ambulatory Visit: Payer: Self-pay

## 2021-04-05 DIAGNOSIS — M47812 Spondylosis without myelopathy or radiculopathy, cervical region: Secondary | ICD-10-CM | POA: Diagnosis not present

## 2021-04-05 DIAGNOSIS — M5412 Radiculopathy, cervical region: Secondary | ICD-10-CM

## 2021-04-15 DIAGNOSIS — Z6830 Body mass index (BMI) 30.0-30.9, adult: Secondary | ICD-10-CM | POA: Diagnosis not present

## 2021-04-15 DIAGNOSIS — R030 Elevated blood-pressure reading, without diagnosis of hypertension: Secondary | ICD-10-CM | POA: Diagnosis not present

## 2021-04-15 DIAGNOSIS — M4722 Other spondylosis with radiculopathy, cervical region: Secondary | ICD-10-CM | POA: Diagnosis not present

## 2021-04-15 DIAGNOSIS — M542 Cervicalgia: Secondary | ICD-10-CM | POA: Diagnosis not present

## 2021-04-23 DIAGNOSIS — E119 Type 2 diabetes mellitus without complications: Secondary | ICD-10-CM | POA: Diagnosis not present

## 2021-04-23 DIAGNOSIS — Z1211 Encounter for screening for malignant neoplasm of colon: Secondary | ICD-10-CM | POA: Diagnosis not present

## 2021-04-23 DIAGNOSIS — K5730 Diverticulosis of large intestine without perforation or abscess without bleeding: Secondary | ICD-10-CM | POA: Diagnosis not present

## 2021-05-21 DIAGNOSIS — M4312 Spondylolisthesis, cervical region: Secondary | ICD-10-CM | POA: Diagnosis not present

## 2021-05-29 DIAGNOSIS — M4722 Other spondylosis with radiculopathy, cervical region: Secondary | ICD-10-CM | POA: Diagnosis not present

## 2021-05-29 DIAGNOSIS — M4802 Spinal stenosis, cervical region: Secondary | ICD-10-CM | POA: Diagnosis not present

## 2021-05-29 DIAGNOSIS — Z981 Arthrodesis status: Secondary | ICD-10-CM | POA: Diagnosis not present

## 2021-05-29 DIAGNOSIS — M4312 Spondylolisthesis, cervical region: Secondary | ICD-10-CM | POA: Diagnosis not present

## 2021-06-19 DIAGNOSIS — H169 Unspecified keratitis: Secondary | ICD-10-CM | POA: Diagnosis not present

## 2021-06-19 DIAGNOSIS — T1591XA Foreign body on external eye, part unspecified, right eye, initial encounter: Secondary | ICD-10-CM | POA: Diagnosis not present

## 2021-06-19 DIAGNOSIS — E119 Type 2 diabetes mellitus without complications: Secondary | ICD-10-CM | POA: Diagnosis not present

## 2021-06-19 DIAGNOSIS — H40013 Open angle with borderline findings, low risk, bilateral: Secondary | ICD-10-CM | POA: Diagnosis not present

## 2021-06-20 DIAGNOSIS — M4312 Spondylolisthesis, cervical region: Secondary | ICD-10-CM | POA: Diagnosis not present

## 2021-07-08 DIAGNOSIS — M25571 Pain in right ankle and joints of right foot: Secondary | ICD-10-CM | POA: Diagnosis not present

## 2021-07-08 DIAGNOSIS — M79606 Pain in leg, unspecified: Secondary | ICD-10-CM | POA: Diagnosis not present

## 2021-08-12 DIAGNOSIS — Z1211 Encounter for screening for malignant neoplasm of colon: Secondary | ICD-10-CM | POA: Diagnosis not present

## 2021-08-12 DIAGNOSIS — K5730 Diverticulosis of large intestine without perforation or abscess without bleeding: Secondary | ICD-10-CM | POA: Diagnosis not present

## 2021-08-12 DIAGNOSIS — K648 Other hemorrhoids: Secondary | ICD-10-CM | POA: Diagnosis not present

## 2021-09-30 DIAGNOSIS — M4312 Spondylolisthesis, cervical region: Secondary | ICD-10-CM | POA: Diagnosis not present

## 2021-11-19 DIAGNOSIS — L570 Actinic keratosis: Secondary | ICD-10-CM | POA: Diagnosis not present

## 2021-11-19 DIAGNOSIS — E1169 Type 2 diabetes mellitus with other specified complication: Secondary | ICD-10-CM | POA: Diagnosis not present

## 2021-11-19 DIAGNOSIS — M25552 Pain in left hip: Secondary | ICD-10-CM | POA: Diagnosis not present

## 2021-12-25 DIAGNOSIS — H40052 Ocular hypertension, left eye: Secondary | ICD-10-CM | POA: Diagnosis not present

## 2021-12-25 DIAGNOSIS — H40013 Open angle with borderline findings, low risk, bilateral: Secondary | ICD-10-CM | POA: Diagnosis not present

## 2022-04-02 DIAGNOSIS — Z125 Encounter for screening for malignant neoplasm of prostate: Secondary | ICD-10-CM | POA: Diagnosis not present

## 2022-04-02 DIAGNOSIS — Z0000 Encounter for general adult medical examination without abnormal findings: Secondary | ICD-10-CM | POA: Diagnosis not present

## 2022-04-02 DIAGNOSIS — Z13220 Encounter for screening for lipoid disorders: Secondary | ICD-10-CM | POA: Diagnosis not present

## 2022-04-02 DIAGNOSIS — E1169 Type 2 diabetes mellitus with other specified complication: Secondary | ICD-10-CM | POA: Diagnosis not present

## 2022-04-08 DIAGNOSIS — E1169 Type 2 diabetes mellitus with other specified complication: Secondary | ICD-10-CM | POA: Diagnosis not present

## 2022-04-08 DIAGNOSIS — J3489 Other specified disorders of nose and nasal sinuses: Secondary | ICD-10-CM | POA: Diagnosis not present

## 2022-04-08 DIAGNOSIS — Z0000 Encounter for general adult medical examination without abnormal findings: Secondary | ICD-10-CM | POA: Diagnosis not present

## 2022-04-08 DIAGNOSIS — S60451A Superficial foreign body of left index finger, initial encounter: Secondary | ICD-10-CM | POA: Diagnosis not present

## 2022-04-08 DIAGNOSIS — E782 Mixed hyperlipidemia: Secondary | ICD-10-CM | POA: Diagnosis not present

## 2022-04-17 DIAGNOSIS — Z23 Encounter for immunization: Secondary | ICD-10-CM | POA: Diagnosis not present

## 2022-08-31 DIAGNOSIS — M79671 Pain in right foot: Secondary | ICD-10-CM | POA: Diagnosis not present

## 2022-08-31 DIAGNOSIS — M25571 Pain in right ankle and joints of right foot: Secondary | ICD-10-CM | POA: Diagnosis not present

## 2022-09-17 DIAGNOSIS — M25571 Pain in right ankle and joints of right foot: Secondary | ICD-10-CM | POA: Diagnosis not present

## 2022-09-23 DIAGNOSIS — M25571 Pain in right ankle and joints of right foot: Secondary | ICD-10-CM | POA: Diagnosis not present

## 2022-12-10 DIAGNOSIS — L853 Xerosis cutis: Secondary | ICD-10-CM | POA: Diagnosis not present

## 2022-12-10 DIAGNOSIS — L570 Actinic keratosis: Secondary | ICD-10-CM | POA: Diagnosis not present

## 2022-12-10 DIAGNOSIS — L720 Epidermal cyst: Secondary | ICD-10-CM | POA: Diagnosis not present

## 2022-12-10 DIAGNOSIS — L821 Other seborrheic keratosis: Secondary | ICD-10-CM | POA: Diagnosis not present

## 2023-04-19 DIAGNOSIS — E1169 Type 2 diabetes mellitus with other specified complication: Secondary | ICD-10-CM | POA: Diagnosis not present

## 2023-04-19 DIAGNOSIS — Z125 Encounter for screening for malignant neoplasm of prostate: Secondary | ICD-10-CM | POA: Diagnosis not present

## 2023-04-19 DIAGNOSIS — Z13220 Encounter for screening for lipoid disorders: Secondary | ICD-10-CM | POA: Diagnosis not present

## 2023-04-19 DIAGNOSIS — Z0000 Encounter for general adult medical examination without abnormal findings: Secondary | ICD-10-CM | POA: Diagnosis not present

## 2023-04-22 DIAGNOSIS — Z0000 Encounter for general adult medical examination without abnormal findings: Secondary | ICD-10-CM | POA: Diagnosis not present

## 2023-04-22 DIAGNOSIS — Z23 Encounter for immunization: Secondary | ICD-10-CM | POA: Diagnosis not present

## 2024-05-03 DIAGNOSIS — E782 Mixed hyperlipidemia: Secondary | ICD-10-CM | POA: Diagnosis not present

## 2024-05-03 DIAGNOSIS — E1169 Type 2 diabetes mellitus with other specified complication: Secondary | ICD-10-CM | POA: Diagnosis not present

## 2024-05-03 DIAGNOSIS — Z0000 Encounter for general adult medical examination without abnormal findings: Secondary | ICD-10-CM | POA: Diagnosis not present

## 2024-05-10 DIAGNOSIS — Z0000 Encounter for general adult medical examination without abnormal findings: Secondary | ICD-10-CM | POA: Diagnosis not present

## 2024-05-10 DIAGNOSIS — E1169 Type 2 diabetes mellitus with other specified complication: Secondary | ICD-10-CM | POA: Diagnosis not present

## 2024-05-10 DIAGNOSIS — E782 Mixed hyperlipidemia: Secondary | ICD-10-CM | POA: Diagnosis not present

## 2024-05-29 ENCOUNTER — Emergency Department (HOSPITAL_BASED_OUTPATIENT_CLINIC_OR_DEPARTMENT_OTHER): Admitting: Radiology

## 2024-05-29 ENCOUNTER — Emergency Department (HOSPITAL_BASED_OUTPATIENT_CLINIC_OR_DEPARTMENT_OTHER)
Admission: EM | Admit: 2024-05-29 | Discharge: 2024-05-29 | Disposition: A | Discharge status: Home / Self Care | Source: Ambulatory Visit

## 2024-05-29 DIAGNOSIS — Z7984 Long term (current) use of oral hypoglycemic drugs: Secondary | ICD-10-CM | POA: Diagnosis not present

## 2024-05-29 DIAGNOSIS — E119 Type 2 diabetes mellitus without complications: Secondary | ICD-10-CM | POA: Diagnosis not present

## 2024-05-29 DIAGNOSIS — I509 Heart failure, unspecified: Secondary | ICD-10-CM | POA: Diagnosis not present

## 2024-05-29 DIAGNOSIS — M7989 Other specified soft tissue disorders: Secondary | ICD-10-CM | POA: Diagnosis not present

## 2024-05-29 DIAGNOSIS — Z7982 Long term (current) use of aspirin: Secondary | ICD-10-CM | POA: Insufficient documentation

## 2024-05-29 DIAGNOSIS — R0602 Shortness of breath: Secondary | ICD-10-CM | POA: Diagnosis not present

## 2024-05-29 DIAGNOSIS — R635 Abnormal weight gain: Secondary | ICD-10-CM | POA: Diagnosis not present

## 2024-05-29 DIAGNOSIS — I110 Hypertensive heart disease with heart failure: Secondary | ICD-10-CM | POA: Diagnosis not present

## 2024-05-29 LAB — CBC
HCT: 40.9 % (ref 39.0–52.0)
Hemoglobin: 14.1 g/dL (ref 13.0–17.0)
MCH: 30.9 pg (ref 26.0–34.0)
MCHC: 34.5 g/dL (ref 30.0–36.0)
MCV: 89.5 fL (ref 80.0–100.0)
Platelets: 131 K/uL — ABNORMAL LOW (ref 150–400)
RBC: 4.57 MIL/uL (ref 4.22–5.81)
RDW: 14 % (ref 11.5–15.5)
WBC: 4.7 K/uL (ref 4.0–10.5)
nRBC: 0 % (ref 0.0–0.2)

## 2024-05-29 LAB — BASIC METABOLIC PANEL WITH GFR
Anion gap: 11 (ref 5–15)
BUN: 18 mg/dL (ref 8–23)
CO2: 26 mmol/L (ref 22–32)
Calcium: 9.7 mg/dL (ref 8.9–10.3)
Chloride: 109 mmol/L (ref 98–111)
Creatinine, Ser: 1 mg/dL (ref 0.61–1.24)
GFR, Estimated: >60 mL/min (ref >60)
Glucose, Bld: 124 mg/dL — ABNORMAL HIGH (ref 70–99)
Potassium: 3.9 mmol/L (ref 3.5–5.1)
Sodium: 146 mmol/L — ABNORMAL HIGH (ref 135–145)

## 2024-05-29 LAB — CBG MONITORING, ED: Glucose-Capillary: 111 mg/dL — ABNORMAL HIGH (ref 70–99)

## 2024-05-29 LAB — TSH: TSH: 1.43 u[IU]/mL (ref 0.350–4.500)

## 2024-05-29 LAB — PRO BRAIN NATRIURETIC PEPTIDE: Pro Brain Natriuretic Peptide: 505 pg/mL — ABNORMAL HIGH (ref <300.0)

## 2024-05-29 MED ORDER — FUROSEMIDE 10 MG/ML IJ SOLN
40.0000 mg | Freq: Once | INTRAMUSCULAR | Status: AC
  Administered 2024-05-29: 40 mg via INTRAVENOUS
  Filled 2024-05-29: qty 4

## 2024-05-29 MED ORDER — FUROSEMIDE 20 MG PO TABS: 20.0000 mg | ORAL_TABLET | Freq: Every day | ORAL | 0 refills | Status: DC

## 2024-05-29 NOTE — Discharge Instructions (Signed)
 Please start the Lasix  daily.  If you do not hear anything from Dr. Jeffrie in their office in the next day please give their office a call.  Return to the emergency department for worsening symptoms or worsening shortness of breath.

## 2024-05-29 NOTE — ED Provider Notes (Signed)
 [place] EMERGENCY DEPARTMENT AT [facility] Provider Note   CSN: [id number] Arrival date & time: 05/29/24  1102     Patient presents with: Shortness of Breath   Russell [name] is a 63 y.o. male.   63 year old male with past medical history of diabetes and diverticulitis in the past presenting to the emergency department today with concern for weight gain and some dyspnea with exertion.  The patient states that he has noticed this over the past week or so.  States that he has noticed his lower extremity swelling as well.  The patient states that he has noticed some dyspnea with exertion when he walks 5 to 6 miles per day.  He states he is still able to do this but he does feel more fatigue and it is taking him longer now.  He initially went to urgent care after he was unable to get into see his primary care doctor and they referred him to the emergency department for further evaluation.   Shortness of Breath      Prior to Admission medications   Medication Sig Start Date End Date Taking? Authorizing Provider  furosemide  (LASIX ) 20 MG tablet Take 1 tablet (20 mg total) by mouth daily. 05/29/24  Yes Ula Prentice SAUNDERS, MD  acetaminophen  (TYLENOL ) 325 MG tablet Take 2 tablets (650 mg total) by mouth every 6 (six) hours as needed for mild pain (or Fever >/= 101). 12/02/18   Elgergawy, Brayton RAMAN, MD  acidophilus (RISAQUAD) CAPS capsule Take 1 capsule by mouth 3 (three) times daily. 12/02/18   Elgergawy, Brayton RAMAN, MD  aspirin  EC 81 MG tablet Take 81 mg by mouth daily.    [provider]  bacitracin  ointment Apply 1 application topically 2 (two) times daily. 02/25/19   Freddi Hamilton, MD  calcium  carbonate (TUMS - DOSED IN MG ELEMENTAL CALCIUM ) 500 MG chewable tablet Chew 3 tablets by mouth at bedtime.    [provider]  clomiPHENE (CLOMID) 50 MG tablet Take 25 mg by mouth daily.    [provider]  FARXIGA 10 MG TABS tablet Take 5 mg by mouth daily. 11/08/19    [provider]  glimepiride (AMARYL) 1 MG tablet Take 1 mg by mouth daily with breakfast.    [provider]  metFORMIN (GLUCOPHAGE-XR) 500 MG 24 hr tablet Take 1,000 mg by mouth at bedtime. 09/16/19   [provider]  pravastatin (PRAVACHOL) 10 MG tablet Take 10 mg by mouth once a week. 09/16/19   [provider]    Allergies: Patient has no known allergies.    Review of Systems  Respiratory:  Positive for shortness of breath.   Cardiovascular:  Positive for leg swelling.  All other systems reviewed and are negative.   Updated Vital Signs BP (!) 171/91   Pulse (!) 53   Temp 97.9 F (36.6 C)   Resp 16   SpO2 100%   Physical Exam Vitals and nursing note reviewed.   Gen: NAD Eyes: PERRL, EOMI HEENT: no oropharyngeal swelling Neck: trachea midline Resp: clear to auscultation bilaterally although diminished at bilateral lung bases Card: RRR, no murmurs, rubs, or gallops Abd: nontender, nondistended Extremities: no calf tenderness, 1+ pitting edema noted to the bilateral lower extremities Vascular: 2+ radial pulses bilaterally, 2+ DP pulses bilaterally Skin: no rashes Psyc: acting appropriately   (all labs ordered are listed, but only abnormal results are displayed) Labs Reviewed  BASIC METABOLIC PANEL WITH GFR - Abnormal; Notable for the following  components:      Result Value   Sodium 146 (*)    Glucose, Bld 124 (*)    All other components within normal limits  CBC - Abnormal; Notable for the following components:   Platelets 131 (*)    All other components within normal limits  PRO BRAIN NATRIURETIC PEPTIDE - Abnormal; Notable for the following components:   Pro Brain Natriuretic Peptide 505.0 (*)    All other components within normal limits  CBG MONITORING, ED - Abnormal; Notable for the following components:   Glucose-Capillary 111 (*)    All other components within normal limits  TSH    EKG: EKG  Interpretation Date/Time:  Monday May 29 2024 11:12:21 EDT Ventricular Rate:  61 PR Interval:  188 QRS Duration:  100 QT Interval:  409 QTC Calculation: 412 R Axis:   22  Text Interpretation: Sinus rhythm Abnormal R-wave progression, early transition Confirmed by Ula Barter [id number]) on 05/29/2024 11:23:33 AM  Radiology: [facility] Chest 2 View Result Date: 05/29/2024 CLINICAL DATA:  Shortness of breath. EXAM: CHEST - 2 VIEW COMPARISON:  03/08/2018. FINDINGS: The heart size and mediastinal contours are within normal limits. Bilateral central perihilar interstitial prominence, suggestive of pulmonary vascular congestion. No focal consolidation, pleural effusion, or pneumothorax. Postsurgical changes of the cervical spine. No acute osseous abnormality. IMPRESSION: Findings suggestive of pulmonary vascular congestion. Electronically Signed   By: Harrietta Sherry M.D.   On: 05/29/2024 12:25     Procedures   Medications Ordered in the ED  furosemide  (LASIX ) injection 40 mg (40 mg Intravenous Given 05/29/24 1245)                                    Medical Decision Making 63 year old male with past medical history of diabetes and diverticulitis in the past presenting to the emergency department today with lower extremity swelling as well as some dyspnea on exertion.  The patient still does have significant exercise tolerance despite this.  Will further evaluate him here with basic labs as well as a BNP and chest x-ray to evaluate for CHF or renal failure.  Will also obtain a TSH to eval for thyroid  dysfunction.  I will reevaluate for ultimate disposition.  The patient's BNP is mildly elevated and x-ray does show some mild vascular congestion consistent with new onset CHF.  I did call and discussed this with Dr. Jeffrie.  The patient has been ambulatory here and is not having any real shortness of breath with exertion walking around here.  Will start the patient on the Lasix  and Dr. Jeffrie will have him  follow-up in short order with their clinic.  He is discharged with return precautions and is feeling well with ambulation and is in agreement with this plan.  Amount and/or Complexity of Data Reviewed Labs: ordered. Radiology: ordered.  Risk Prescription drug management.        Final diagnoses:  Congestive heart failure, unspecified HF chronicity, unspecified heart failure type [facility])    ED Discharge Orders          Ordered    furosemide  (LASIX ) 20 MG tablet  Daily        05/29/24 1426               Ula Barter SAUNDERS, MD 05/29/24 1426

## 2024-05-29 NOTE — ED Notes (Signed)
Reviewed discharge instructions, medications, and home care with pt. Pt verbalized understanding and had no further questions. Pt exited ED without complications.

## 2024-05-29 NOTE — ED Triage Notes (Signed)
 C/o increased exertional  SHOB and weight gain. States 16 lbs in 4 days. Denies hx of CHF.

## 2024-06-01 ENCOUNTER — Other Ambulatory Visit: Payer: Self-pay | Admitting: Cardiology

## 2024-06-01 ENCOUNTER — Other Ambulatory Visit (INDEPENDENT_AMBULATORY_CARE_PROVIDER_SITE_OTHER)

## 2024-06-01 ENCOUNTER — Emergency Department (HOSPITAL_COMMUNITY)

## 2024-06-01 ENCOUNTER — Emergency Department (HOSPITAL_COMMUNITY)
Admission: EM | Admit: 2024-06-01 | Discharge: 2024-06-01 | Disposition: A | Discharge status: Home / Self Care | Attending: Emergency Medicine | Admitting: Emergency Medicine

## 2024-06-01 ENCOUNTER — Other Ambulatory Visit: Payer: Self-pay

## 2024-06-01 DIAGNOSIS — R0600 Dyspnea, unspecified: Secondary | ICD-10-CM

## 2024-06-01 DIAGNOSIS — R0602 Shortness of breath: Secondary | ICD-10-CM | POA: Insufficient documentation

## 2024-06-01 DIAGNOSIS — R600 Localized edema: Secondary | ICD-10-CM | POA: Insufficient documentation

## 2024-06-01 DIAGNOSIS — R42 Dizziness and giddiness: Secondary | ICD-10-CM | POA: Diagnosis not present

## 2024-06-01 DIAGNOSIS — I509 Heart failure, unspecified: Secondary | ICD-10-CM

## 2024-06-01 DIAGNOSIS — R55 Syncope and collapse: Secondary | ICD-10-CM

## 2024-06-01 DIAGNOSIS — R5383 Other fatigue: Secondary | ICD-10-CM | POA: Insufficient documentation

## 2024-06-01 DIAGNOSIS — Z7982 Long term (current) use of aspirin: Secondary | ICD-10-CM | POA: Diagnosis not present

## 2024-06-01 DIAGNOSIS — E119 Type 2 diabetes mellitus without complications: Secondary | ICD-10-CM | POA: Diagnosis not present

## 2024-06-01 DIAGNOSIS — Z7984 Long term (current) use of oral hypoglycemic drugs: Secondary | ICD-10-CM | POA: Insufficient documentation

## 2024-06-01 DIAGNOSIS — M542 Cervicalgia: Secondary | ICD-10-CM | POA: Insufficient documentation

## 2024-06-01 DIAGNOSIS — R072 Precordial pain: Secondary | ICD-10-CM

## 2024-06-01 DIAGNOSIS — R61 Generalized hyperhidrosis: Secondary | ICD-10-CM | POA: Insufficient documentation

## 2024-06-01 DIAGNOSIS — R531 Weakness: Secondary | ICD-10-CM | POA: Diagnosis not present

## 2024-06-01 DIAGNOSIS — R079 Chest pain, unspecified: Secondary | ICD-10-CM | POA: Diagnosis not present

## 2024-06-01 DIAGNOSIS — R0789 Other chest pain: Secondary | ICD-10-CM | POA: Diagnosis not present

## 2024-06-01 DIAGNOSIS — R112 Nausea with vomiting, unspecified: Secondary | ICD-10-CM | POA: Diagnosis not present

## 2024-06-01 DIAGNOSIS — I209 Angina pectoris, unspecified: Secondary | ICD-10-CM

## 2024-06-01 LAB — TROPONIN I (HIGH SENSITIVITY)
Troponin I (High Sensitivity): 7 ng/L (ref <18)
Troponin I (High Sensitivity): 10 ng/L (ref <18)

## 2024-06-01 LAB — ECHOCARDIOGRAM COMPLETE
AR max vel: 2.81 cm2
AV Area VTI: 2.87 cm2
AV Area mean vel: 2.82 cm2
AV Mean grad: 6 mmHg
AV Peak grad: 11.2 mmHg
AV Vena cont: 0.18 cm
Ao pk vel: 1.68 m/s
Area-P 1/2: 3.72 cm2
S' Lateral: 3.38 cm

## 2024-06-01 LAB — COMPREHENSIVE METABOLIC PANEL WITH GFR
ALT: 26 U/L (ref 0–44)
AST: 27 U/L (ref 15–41)
Albumin: 4.4 g/dL (ref 3.5–5.0)
Alkaline Phosphatase: 65 U/L (ref 38–126)
Anion gap: 12 (ref 5–15)
BUN: 22 mg/dL (ref 8–23)
CO2: 25 mmol/L (ref 22–32)
Calcium: 9.4 mg/dL (ref 8.9–10.3)
Chloride: 102 mmol/L (ref 98–111)
Creatinine, Ser: 1.32 mg/dL — ABNORMAL HIGH (ref 0.61–1.24)
GFR, Estimated: >60 mL/min (ref >60)
Glucose, Bld: 162 mg/dL — ABNORMAL HIGH (ref 70–99)
Potassium: 3.4 mmol/L — ABNORMAL LOW (ref 3.5–5.1)
Sodium: 139 mmol/L (ref 135–145)
Total Bilirubin: 2.2 mg/dL — ABNORMAL HIGH (ref 0.0–1.2)
Total Protein: 7.3 g/dL (ref 6.5–8.1)

## 2024-06-01 LAB — CBC WITH DIFFERENTIAL/PLATELET
Abs Immature Granulocytes: 0.03 K/uL (ref 0.00–0.07)
Basophils Absolute: 0 K/uL (ref 0.0–0.1)
Basophils Relative: 0 %
Eosinophils Absolute: 0 K/uL (ref 0.0–0.5)
Eosinophils Relative: 0 %
HCT: 47.9 % (ref 39.0–52.0)
Hemoglobin: 16.2 g/dL (ref 13.0–17.0)
Immature Granulocytes: 0 %
Lymphocytes Relative: 9 %
Lymphs Abs: 0.7 K/uL (ref 0.7–4.0)
MCH: 30.5 pg (ref 26.0–34.0)
MCHC: 33.8 g/dL (ref 30.0–36.0)
MCV: 90 fL (ref 80.0–100.0)
Monocytes Absolute: 0.5 K/uL (ref 0.1–1.0)
Monocytes Relative: 6 %
Neutro Abs: 6.8 K/uL (ref 1.7–7.7)
Neutrophils Relative %: 85 %
Platelets: 134 K/uL — ABNORMAL LOW (ref 150–400)
RBC: 5.32 MIL/uL (ref 4.22–5.81)
RDW: 13.7 % (ref 11.5–15.5)
WBC: 8.1 K/uL (ref 4.0–10.5)
nRBC: 0 % (ref 0.0–0.2)

## 2024-06-01 LAB — URINALYSIS, W/ REFLEX TO CULTURE (INFECTION SUSPECTED)
Glucose, UA: >=500 mg/dL — AB
Hgb urine dipstick: NEGATIVE
Ketones, ur: 40 mg/dL — AB
Leukocytes,Ua: NEGATIVE
Nitrite: NEGATIVE
Protein, ur: NEGATIVE mg/dL
Specific Gravity, Urine: 1.015 (ref 1.005–1.030)
pH: 5.5 (ref 5.0–8.0)

## 2024-06-01 LAB — TSH: TSH: 1.554 u[IU]/mL (ref 0.350–4.500)

## 2024-06-01 LAB — MAGNESIUM: Magnesium: 2.1 mg/dL (ref 1.7–2.4)

## 2024-06-01 LAB — D-DIMER, QUANTITATIVE: D-Dimer, Quant: 0.28 ug{FEU}/mL (ref 0.00–0.50)

## 2024-06-01 LAB — BRAIN NATRIURETIC PEPTIDE: B Natriuretic Peptide: 15.6 pg/mL (ref 0.0–100.0)

## 2024-06-01 MED ORDER — ASPIRIN 81 MG PO CHEW
81.0000 mg | CHEWABLE_TABLET | Freq: Once | ORAL | Status: AC
  Administered 2024-06-01: 81 mg via ORAL
  Filled 2024-06-01: qty 1

## 2024-06-01 NOTE — Discharge Instructions (Signed)
 Your history, exam, and workup today led us  to consult cardiology to help rule out concerning causes such as a cardiac cause of your symptoms this morning.  They wanted you to get an echo today and would prefer you be discharged to go get it done at [facility].  Please go directly to have that done today.  Please follow-up with him in clinic for cardiology follow-up and if any symptoms change or worsen acutely, please return to the nearest emergency department immediately.

## 2024-06-01 NOTE — Progress Notes (Signed)
 Ordering outpatient coronary CTA and echocardiogram to [facility] Dr. Jeffrie to read  Russell DELENA Donath, PA-C 06/01/2024 1:05 PM

## 2024-06-01 NOTE — ED Notes (Signed)
 Pt back in room and on monitor. Urine was sent to lab although it was a tiny amount.

## 2024-06-01 NOTE — ED Triage Notes (Addendum)
 Pt/wife stated, he has CHF  and walks 5 miles everyday. This morning walked the 5 miles came back in and was sweating more than normal. He did have pain in the left side of his neck and was dizzy and nauseated, walked to bathroom and came back out. Was making some eggs and felt bad enough to call 911 , decided to cancel the 911. Noticed he dropped his necklace , got in golf cart to look for it. He then slumped over and was passed out. I got him back to house and got him in chair . We decided then went to ER.  He has been taking Lasix  for 2 days for CHF , he emptied out 6+ urinals on Monday.while he was at [employer] on Monday. Due to gained 16lbs in 4 days.

## 2024-06-01 NOTE — ED Notes (Signed)
 Called lab and had trop. Added to labs already sent.

## 2024-06-01 NOTE — ED Notes (Signed)
 Called main lab and ask why 1st trop wasn't ran yet. Lab tech said she was sorry that happened and adding it on now.

## 2024-06-01 NOTE — ED Provider Notes (Signed)
 [place] EMERGENCY DEPARTMENT AT [facility] Provider Note   CSN: [id number] Arrival date & time: 06/01/24  9153     Patient presents with: Excessive Sweating, Chest Pain, Loss of Consciousness, Dizziness, Nausea, Neck Pain, and Weakness   DEITRICK [name] is a 63 y.o. male.   The history is provided by the patient and medical records.  Chest Pain Pain location:  Substernal area and L chest Pain quality: aching, crushing, dull and pressure   Pain radiates to:  Neck, L shoulder and precordial region Pain severity:  Severe Onset quality:  Sudden Duration:  1 hour Timing:  Constant Progression:  Resolved Chronicity:  New Relieved by:  Nothing Worsened by:  Nothing Associated symptoms: diaphoresis, fatigue, nausea, shortness of breath, syncope, vomiting and weakness   Associated symptoms: no abdominal pain, no altered mental status, no anxiety, no back pain, no cough, no dizziness, no fever, no headache, no lower extremity edema, no numbness and no palpitations   Loss of Consciousness Episode history:  Single Most recent episode:  Today Progression:  Resolved Chronicity:  New Associated symptoms: chest pain, diaphoresis, nausea, shortness of breath, vomiting and weakness   Associated symptoms: no anxiety, no dizziness, no fever, no headaches and no palpitations   Dizziness Associated symptoms: chest pain, nausea, shortness of breath, syncope, vomiting and weakness   Associated symptoms: no diarrhea, no headaches and no palpitations   Neck Pain Associated symptoms: chest pain, syncope and weakness   Associated symptoms: no fever, no headaches and no numbness   Weakness Associated symptoms: chest pain, nausea, shortness of breath, syncope and vomiting   Associated symptoms: no abdominal pain, no cough, no diarrhea, no dizziness, no dysuria, no fever and no headaches        Prior to Admission medications   Medication Sig Start Date End Date Taking? Authorizing  Provider  acetaminophen  (TYLENOL ) 325 MG tablet Take 2 tablets (650 mg total) by mouth every 6 (six) hours as needed for mild pain (or Fever >/= 101). 12/02/18   Elgergawy, Brayton RAMAN, MD  acidophilus (RISAQUAD) CAPS capsule Take 1 capsule by mouth 3 (three) times daily. 12/02/18   Elgergawy, Brayton RAMAN, MD  aspirin  EC 81 MG tablet Take 81 mg by mouth daily.    [provider]  bacitracin  ointment Apply 1 application topically 2 (two) times daily. 02/25/19   Freddi Hamilton, MD  calcium  carbonate (TUMS - DOSED IN MG ELEMENTAL CALCIUM ) 500 MG chewable tablet Chew 3 tablets by mouth at bedtime.    [provider]  clomiPHENE (CLOMID) 50 MG tablet Take 25 mg by mouth daily.    [provider]  FARXIGA 10 MG TABS tablet Take 5 mg by mouth daily. 11/08/19   [provider]  furosemide  (LASIX ) 20 MG tablet Take 1 tablet (20 mg total) by mouth daily. 05/29/24   Ula Prentice SAUNDERS, MD  glimepiride (AMARYL) 1 MG tablet Take 1 mg by mouth daily with breakfast.    [provider]  metFORMIN (GLUCOPHAGE-XR) 500 MG 24 hr tablet Take 1,000 mg by mouth at bedtime. 09/16/19   [provider]  pravastatin (PRAVACHOL) 10 MG tablet Take 10 mg by mouth once a week. 09/16/19   [provider]    Allergies: Patient has no known allergies.    Review of Systems  Constitutional:  Positive for diaphoresis and fatigue. Negative for chills and fever.  HENT:  Negative for congestion.   Eyes:  Negative for visual disturbance.  Respiratory:  Positive for chest tightness and shortness of breath. Negative for cough, wheezing and stridor.   Cardiovascular:  Positive for chest pain, leg swelling (resolving) and syncope. Negative for palpitations.  Gastrointestinal:  Positive for nausea and vomiting. Negative for abdominal pain, constipation and diarrhea.  Genitourinary:  Negative for dysuria and flank pain.  Musculoskeletal:  Positive for neck pain. Negative for back pain.   Skin:  Negative for rash.  Neurological:  Positive for syncope, weakness and light-headedness. Negative for dizziness, numbness and headaches.  Psychiatric/Behavioral:  Negative for agitation.   All other systems reviewed and are negative.   Updated Vital Signs BP 111/72 (BP Location: Right Arm)   Pulse 61   Temp (!) 97.4 F (36.3 C) (Oral)   Resp 17   SpO2 96%   Physical Exam Vitals and nursing note reviewed.  Constitutional:      General: He is not in acute distress.    Appearance: He is well-developed. He is not ill-appearing, toxic-appearing or diaphoretic.  HENT:     Head: Normocephalic and atraumatic.  Eyes:     Conjunctiva/sclera: Conjunctivae normal.  Cardiovascular:     Rate and Rhythm: Normal rate and regular rhythm.     Heart sounds: Normal heart sounds. No murmur heard. Pulmonary:     Effort: Pulmonary effort is normal. No respiratory distress.     Breath sounds: Rales present. No decreased breath sounds, wheezing or rhonchi.  Chest:     Chest wall: No tenderness.  Abdominal:     Palpations: Abdomen is soft.     Tenderness: There is no abdominal tenderness.  Musculoskeletal:        General: No swelling.     Cervical back: Neck supple.     Right lower leg: Edema (mild) present.     Left lower leg: Edema (mild) present.  Skin:    General: Skin is warm and dry.     Capillary Refill: Capillary refill takes less than 2 seconds.     Findings: No erythema or rash.  Neurological:     General: No focal deficit present.     Mental Status: He is alert.  Psychiatric:        Mood and Affect: Mood normal.     (all labs ordered are listed, but only abnormal results are displayed) Labs Reviewed  CBC WITH DIFFERENTIAL/PLATELET - Abnormal; Notable for the following components:      Result Value   Platelets 134 (*)    All other components within normal limits  COMPREHENSIVE METABOLIC PANEL WITH GFR - Abnormal; Notable for the following components:   Potassium 3.4  (*)    Glucose, Bld 162 (*)    Creatinine, Ser 1.32 (*)    Total Bilirubin 2.2 (*)    All other components within normal limits  URINALYSIS, W/ REFLEX TO CULTURE (INFECTION SUSPECTED) - Abnormal; Notable for the following components:   Glucose, UA >=500 (*)    Bilirubin Urine SMALL (*)    Ketones, ur 40 (*)    Bacteria, UA RARE (*)    All other components within normal limits  D-DIMER, QUANTITATIVE  BRAIN NATRIURETIC PEPTIDE  MAGNESIUM   TSH  TROPONIN I (HIGH SENSITIVITY)  TROPONIN I (HIGH SENSITIVITY)    EKG: EKG Interpretation Date/Time:  Thursday June 01 2024 09:59:27 EDT Ventricular Rate:  63 PR Interval:  191 QRS Duration:  106 QT Interval:  437 QTC Calculation: 448 R Axis:   18  Text Interpretation: Sinus rhythm Abnormal R-wave progression, early transition Left  ventricular hypertrophy Borderline T abnormalities, inferior leads when compared to prior, more promiminent S1Q3T3 pattern No sTEMI Confirmed by Ginger Barefoot ([id number]) on 06/01/2024 10:07:44 AM  Radiology: DG Chest Portable 1 View Result Date: 06/01/2024 CLINICAL DATA:  Chest pain and shortness of breath. EXAM: PORTABLE CHEST 1 VIEW COMPARISON:  Chest radiograph dated 05/29/2024. FINDINGS: No focal consolidation, pleural effusion or pneumothorax. The cardiac silhouette is within normal limits. No acute osseous pathology. IMPRESSION: No active disease. Electronically Signed   By: Vanetta Chou M.D.   On: 06/01/2024 10:12     Procedures   Medications Ordered in the ED  aspirin  chewable tablet 81 mg (81 mg Oral Given 06/01/24 0955)                                    Medical Decision Making Amount and/or Complexity of Data Reviewed Labs: ordered. Radiology: ordered.  Risk OTC drugs.    Russell [name] is a 63 y.o. male with a past medical history significant for kidney stones, diabetes, diverticulitis, and previous fluid overload/heart failure who presents for new chest pain, shortness of breath,  and a syncopal episode.  According to patient, he recently got back from [facility] [facility] last week and since then he had gained about 16 pounds in several days.  He went to [facility] emergency department several days ago with the fluid overload with exertional shortness of breath and edema and was able to be diuresed and sent home to follow-up with outpatient cardiology.  They were called during that visit and recommended discharge.  Patient said that since that he is lost all of the extra weight and is back just below his weight before his trip.  He said that he normally walks about 5 miles a day and yesterday did not have any difficulty.  He reports that this morning he went on his walk and when he got back to his house to the bottom of his driveway, he started having left-sided chest pressure, shoulder pain, shoulder blade pain, and left neck pain.  He reports he got very sweaty, short of breath and his symptoms were exertional.  He reports he got to the house and he started having dry heaving and nausea and vomiting.  He reports the chest pain was a 9 out of 10 in severity and he is never had this before.  He then was resting and started feel little bit better but realized he was missing a necklace so he went with his wife to retrace his walk to find the necklace and while on a golf cart he had a syncopal episode and was passed out for several minutes before getting back to the house.  He does not member what happened.  He denies palpitations but was extremely diaphoretic during the chest pain and the syncopal episode.  He has not taken any aspirin  and has never had this before.  He also denies history of DVT or PE but reports his edema in both legs was symmetric after his trip to [facility].  He thought it was just from eating crab legs and salt on his trip.  On exam today, lungs had very faint rales.  No murmur on my exam.  Chest and abdomen are nontender and I cannot reduce his discomfort.  He does have  intact pulses in all extremities and legs have very minimal edema.  Patient otherwise resting now and is symptom-free.  Blood pressure is about 120 systolic.  He is not tachycardic or tachypneic.  He is not hypoxic.  EKG does not show STEMI.  Clinically I am very concerned about this patient who had exertional extreme diaphoresis, chest pain, shortness of breath, nausea, dry heaving, and syncope today.  We will give him 324 of aspirin  and he will get a workup started.  Will add a D-dimer given his recent travel with the subsequent leg swelling and the chest pain short of breath and syncope.  Anticipate discussion with cardiology as well given the concern for high risk chest pain and syncope and anticipate he will need admission after workup.  Although we are still waiting for the troponin, I called cardiology given my concern for high risk chest pain and syncope.  They will see patient and help give recommendations.  Anticipate admission after workup is completed.  1:50 PM Cardiology saw the patient and would like him to be discharged to go immediately to his appointment to get an echo done this afternoon in 45 minutes at [facility].  Patient is feeling better and agrees with this plan.  The second troponin is still in process and has not yet returned but cardiology says he is safe to go to [facility] immediately to get this ultrasound and follow-up in clinic.  Patient understands return precautions and follow-up instructions and was discharged for outpatient workup to continue.     Final diagnoses:  Chest pain, unspecified type  Syncope and collapse  Diaphoresis  Nausea and vomiting, unspecified vomiting type  Exertional shortness of breath  Exertional chest pain    ED Discharge Orders     None      Clinical Impression: 1. Chest pain, unspecified type   2. Syncope and collapse   3. Diaphoresis   4. Nausea and vomiting, unspecified vomiting type   5. Exertional shortness of  breath   6. Exertional chest pain     Disposition: Discharge  Condition: Good  I have discussed the results, Dx and Tx plan with the pt(& family if present). He/she/they expressed understanding and agree(s) with the plan. Discharge instructions discussed at great length. Strict return precautions discussed and pt &/or family have verbalized understanding of the instructions. No further questions at time of discharge.    New Prescriptions   No medications on file    Follow Up: No follow-up provider specified.     Wilbert Hayashi, Lonni PARAS, MD 06/01/24 [phone]

## 2024-06-01 NOTE — ED Notes (Signed)
 Ambulatory to restroom. Pt given urine cup to provide sample.

## 2024-06-01 NOTE — Consult Note (Addendum)
 Cardiology Consultation   Patient ID: Russell [name] MRN: [id number]; DOB: Jan 29, 1961   Admission date: 06/01/2024  PCP:  Okey Carlin Redbird, MD   [facility] Providers Cardiologist:  Oneil Parchment, MD     Chief Complaint:  chest pain  Patient Profile: Russell [name] is a 63 y.o. male with diabetes, diverticulitis, history of angina with normal coronaries on [facility] 12/2016, hyperlipidemia who is being seen 06/01/2024 for the evaluation of chest pain.  History of Present Illness: Russell [name] has past medical history as listed above. He tells me that he was returning from the beach Thursday of last week, after noticing that he had a 16 pound weight gain.  He was seen at [facility] emergency department, he was sent home with p.o. Lasix  20 mg daily after receiving IV Lasix  40 mg x 1 dose.  At this point his proBNP was 505, CXR showed pulmonary vascular congestion.  Prior to this, he had no known history of CHF.  He tells me that after leaving the [facility] emergency department, he continued to take his Lasix  and overall lost 21 pounds.    He then presented to the [facility] emergency department after having an episode this morning of left shoulder/arm pain, dizziness, syncope, nausea.  He tells me that he is typically able to walk around 5 miles every day.  He was on his normal walk and found himself to be more fatigued, diaphoretic, dizzy compared to normal.  He then states that he had lost his necklace at some point during his walk, him and his wife went out on the golf cart to try and retrieve a necklace, at this time he had a syncopal episode.  He did notes no prodrome, says that the syncopal episode was reported by his wife.  He does state that there is a chance that he could be dehydrated, taking Lasix  every day and being 21 pounds down from when he came back from the beach.  Relevant workup while in the ED includes: Creatinine elevated at 1.32 compared to 3 days ago when it was  1.0, hypokalemic at 3.4, compared to 3.93 days ago.  Still waiting on troponin levels and BNP.  CXR shows no pleural effusions, no active disease.  Patient is not currently experiencing any symptoms.  Of note patient underwent a left heart catheterization in March 2018 which showed normal coronary arteries, normal LV function.  He also had an echocardiogram done in January 2021 which showed EF of 55 to 60%, normal RV function, moderately dilated LA, no valvular abnormalities.  Patient reports a strong family history of heart disease.  Noting that his father had multiple heart attacks, and eventually ended up dying of cancer/congestive heart failure.  He does have a personal history of diabetes, hyperlipidemia.   After speaking with the patient, it sounds like his symptoms are most consistent with dehydration in the setting of new Lasix  and strong diuresis. He reports being 21 lb lighter after starting Lasix . Creatinine elevated to 1.32 from 1.00 7/28, mildly hypokalemic today at 3.4.   Past Medical History:  Diagnosis Date   Abnormality of gait and mobility    Acute diverticulitis    Angina pectoris (HCC) 01/28/2017   Arthritis    Diabetes mellitus without complication (HCC)    Diverticulitis    History of kidney stones    Indigestion    Newly diagnosed diabetes (HCC) 12/09/2017   Pneumonia    history of times one - 2010   PONV (  postoperative nausea and vomiting)    S/P bilateral UKR 10/31/2015   Wears glasses    Past Surgical History:  Procedure Laterality Date   ACL bilat knee     repair    bilat achilles replacement      BILATERAL KNEE ARTHROSCOPY     CERVICAL SPINE SURGERY     C6 secondary to trauma    HERNIA REPAIR     inguinal - left    LEFT HEART CATH AND CORONARY ANGIOGRAPHY N/A 01/28/2017   Procedure: Left Heart Cath and Coronary Angiography;  Surgeon: Victory LELON Sharps, MD;  Location: [facility] INVASIVE CV LAB;  Service: Cardiovascular;  Laterality: N/A;   MEDIAL COLLATERAL LIGAMENT  REPAIR, KNEE     bilat    PARTIAL KNEE ARTHROPLASTY Bilateral 10/31/2015   Procedure: UNICOMPARTMENTAL KNEE BILATERAL MEDIALLY;  Surgeon: Donnice Car, MD;  Location: [facility] ORS;  Service: Orthopedics;  Laterality: Bilateral;   ROTATOR CUFF REPAIR     right shoulder     Medications Prior to Admission: Prior to Admission medications   Medication Sig Start Date End Date Taking? Authorizing Provider  acetaminophen  (TYLENOL ) 325 MG tablet Take 2 tablets (650 mg total) by mouth every 6 (six) hours as needed for mild pain (or Fever >/= 101). 12/02/18   Elgergawy, Brayton RAMAN, MD  acidophilus (RISAQUAD) CAPS capsule Take 1 capsule by mouth 3 (three) times daily. 12/02/18   Elgergawy, Brayton RAMAN, MD  aspirin  EC 81 MG tablet Take 81 mg by mouth daily.    [provider]  bacitracin  ointment Apply 1 application topically 2 (two) times daily. 02/25/19   Freddi Hamilton, MD  calcium  carbonate (TUMS - DOSED IN MG ELEMENTAL CALCIUM ) 500 MG chewable tablet Chew 3 tablets by mouth at bedtime.    [provider]  clomiPHENE (CLOMID) 50 MG tablet Take 25 mg by mouth daily.    [provider]  FARXIGA 10 MG TABS tablet Take 5 mg by mouth daily. 11/08/19   [provider]  furosemide  (LASIX ) 20 MG tablet Take 1 tablet (20 mg total) by mouth daily. 05/29/24   Ula Prentice SAUNDERS, MD  glimepiride (AMARYL) 1 MG tablet Take 1 mg by mouth daily with breakfast.    [provider]  metFORMIN (GLUCOPHAGE-XR) 500 MG 24 hr tablet Take 1,000 mg by mouth at bedtime. 09/16/19   [provider]  pravastatin (PRAVACHOL) 10 MG tablet Take 10 mg by mouth once a week. 09/16/19   [provider]    Allergies:   No Known Allergies  Social History:   Social History   Socioeconomic History   Marital status: Married    Spouse name: Not on file   Number of children: Not on file   Years of education: Not on file   Highest education level: Not on file  Occupational History   Not on file   Tobacco Use   Smoking status: Never   Smokeless tobacco: Never  Vaping Use   Vaping status: Never Used  Substance and Sexual Activity   Alcohol use: Yes    Comment: rarely    Drug use: No   Sexual activity: Not on file  Other Topics Concern   Not on file  Social History Narrative   Not on file   Social Drivers of Health   Financial Resource Strain: Not on file  Food Insecurity: Not on file  Transportation Needs: Not on file  Physical Activity: Not on file  Stress: Not on file  Social  Connections: Not on file  Intimate Partner Violence: Not on file    Family History:   The patient's family history includes Heart attack in his father; Heart disease in his mother; Lung cancer in his father; Pancreatic cancer in his mother.    ROS:  Please see the history of present illness.  All other ROS reviewed and negative.     Physical Exam/Data: Vitals:   06/01/24 0950 06/01/24 1045 06/01/24 1230 06/01/24 1240  BP: 132/71 131/81    Pulse: (!) 59 (!) 59 64 68  Resp:  14 18 (!) 24  Temp:      TempSrc:      SpO2: 100% 96% 100% 97%   No intake or output data in the 24 hours ending 06/01/24 1259    05/29/2024    3:10 PM 11/14/2019    3:42 PM 02/25/2019    7:59 PM  Last 3 Weights  Weight (lbs) 159 lb 3.2 oz 292 lb 285 lb  Weight (kg) 72.213 kg 132.45 kg 129.275 kg     There is no height or weight on file to calculate BMI.   General:  Well nourished, well developed, in no acute distress HEENT: normal Neck: no JVD Vascular: No carotid bruits; Distal pulses 2+ bilaterally   Cardiac:  normal S1, S2; RRR; no murmur  Lungs:  clear to auscultation bilaterally, no wheezing, rhonchi or rales  Abd: soft, nontender, no hepatomegaly  Ext: no edema Musculoskeletal:  No deformities, BUE and BLE strength normal and equal Skin: warm and dry  Neuro:  no focal abnormalities noted Psych:  Normal affect   EKG:  The ECG that was done 06/01/2024 was personally reviewed and demonstrates sinus  rhythm, HR 63, no acute ischemic changes  Relevant CV Studies:  Echocardiogram, 11/22/2019 Left ventricular ejection fraction, by visual estimation, is 55 to 60% . The left ventricle has normal function. There is no left ventricular hypertrophy.  The left ventricle has no regional wall motion abnormalities.  Normal GLS - 18. 1.  Global right ventricle has normal systolic function. The right ventricular size is normal. No increase in right ventricular wall thickness.  Left atrial size was moderately dilated.  Right atrial size was normal.  The mitral valve is normal in structure. No evidence of mitral valve regurgitation. No evidence of mitral stenosis.  The tricuspid valve is normal in structure.  The tricuspid valve is normal in structure. Tricuspid valve regurgitation is trivial.  The aortic valve is normal in structure. Aortic valve regurgitation is not visualized. Mild aortic valve sclerosis without stenosis.  The pulmonic valve was grossly normal. Pulmonic valve regurgitation is trivial.  The inferior vena cava is normal in size with greater than 50% respiratory variability, suggesting right atrial pressure of 3 mmHg.  Cardiac catheterization, 01/28/2017 Normal coronary arteries. Right dominant anatomy.  Normal left ventricular function.  Laboratory Data: High Sensitivity Troponin:   Recent Labs  Lab 06/01/24 1008  TROPONINIHS 7      Chemistry Recent Labs  Lab 05/29/24 1116 06/01/24 0920  NA 146* 139  K 3.9 3.4*  CL 109 102  CO2 26 25  GLUCOSE 124* 162*  BUN 18 22  CREATININE 1.00 1.32*  CALCIUM  9.7 9.4  MG  --  2.1  GFRNONAA >60 >60  ANIONGAP 11 12    Recent Labs  Lab 06/01/24 0920  PROT 7.3  ALBUMIN 4.4  AST 27  ALT 26  ALKPHOS 65  BILITOT 2.2*   Lipids No results for input(s):  CHOL, TRIG, HDL, LABVLDL, LDLCALC, CHOLHDL in the last 168 hours. Hematology Recent Labs  Lab 05/29/24 1116 06/01/24 0920  WBC 4.7 8.1  RBC 4.57 5.32  HGB 14.1  16.2  HCT 40.9 47.9  MCV 89.5 90.0  MCH 30.9 30.5  MCHC 34.5 33.8  RDW 14.0 13.7  PLT 131* 134*   Thyroid   Recent Labs  Lab 06/01/24 0920  TSH 1.554   BNP Recent Labs  Lab 05/29/24 1116  PROBNP 505.0*    DDimer  Recent Labs  Lab 06/01/24 0920  DDIMER 0.28   Radiology/Studies:  DG Chest Portable 1 View Result Date: 06/01/2024 CLINICAL DATA:  Chest pain and shortness of breath. EXAM: PORTABLE CHEST 1 VIEW COMPARISON:  Chest radiograph dated 05/29/2024. FINDINGS: No focal consolidation, pleural effusion or pneumothorax. The cardiac silhouette is within normal limits. No acute osseous pathology. IMPRESSION: No active disease. Electronically Signed   By: Vanetta Chou M.D.   On: 06/01/2024 10:12   Assessment and Plan:  Chest pain Diaphoresis Syncopal episode Dehydration  Reported was doing well since being seen in [facility] 7/28 until this morning Came back from his typical 5 mile walk, excessively sweaty Reported associated left shoulder/arm pain, dizziness, syncope Has been recently started on Lasix  which she has been taking daily EKG shows no acute ischemic changes CXR normal [facility] from 12/2016 showed normal coronaries, no disease Patient denies any active chest pain, shortness of breath, dizziness Initial troponin negative Creatinine elevated compared to 7/28, mildly hypokalemic  Suspect symptoms were from dehydration with new Lasix  dose with increased exercise outside in the heat  No active symptoms at this time Resting HR 50-60s Recommend stopping Lasix , likely benefit from PRN dosing in future   Recommend outpatient coronary CTA for ischemic evaluation Recommend outpatient echocardiogram to update   Newly diagnosed CHF, EF normal in 2021 Recently seen at [facility] ED 05/29/2024 -- proBNP elevated at 505, CXR showed pulmonary vascular congestion Given IV Lasix  40 mg x 1 while in the ED at [facility] Discharged with PO Lasix  20 mg daily has been taking since  then Pending BNP Labs show mild hypokalemia, elevated creatinine --suspect secondary to dehydration with new Lasix  Recommend outpatient echocardiogram  Recommend oral hydration  Risk Assessment/Risk Scores:     [place]  Heart Association (NYHA) Functional Class NYHA Class II     For questions or updates, please contact [facility] Please consult [email or web address] for contact info under     Signed, Waddell DELENA Donath, PA-C  06/01/2024 12:59 PM   Personally seen and examined. Agree with above.  63 year old with syncopal episode following 4 days of new start Lasix  with increase in creatinine from 1.0-1.3, decreased potassium, 20 pound weight loss, aggressive diuresis, heavy exercise walks 6 to 7 miles a day, working on weight loss, working on decreasing his hemoglobin A1c down from 11 back to 5.6 with dietary modifications alone.  Also had some left shoulder discomfort surrounding the episode.  First troponin is normal at 7.  Exam is unremarkable.  His wife witnessed his syncopal episode, he was white, pale, had some retching surrounding the incident.  Syncope was likely multifactorial from dehydration from recent Lasix  use, heavy temperatures outside, as well as vagal type episode in the setting of dehydration.  EKG and telemetry are unremarkable here.  In 2018 had a normal heart catheterization.  Plan will be to obtain an echocardiogram as well as a coronary CTA as an outpatient.  This will be a thorough evaluation of both his  anatomy and structure and function.  We want to rule out the possibility of new onset heart failure given his weight gain while at the beach, shortness of breath, interstitial edema on chest x-ray 4 days ago at [facility] ER, elevated BNP.  Feels much better now.  Lungs are clear.  X-ray clear.  Okay for discharge.  We have close follow-up.  Wife present for discussion.  Interested in prevention.  Oneil Parchment, MD

## 2024-06-06 ENCOUNTER — Encounter (HOSPITAL_COMMUNITY): Payer: Self-pay

## 2024-06-08 ENCOUNTER — Ambulatory Visit (HOSPITAL_COMMUNITY)

## 2024-06-09 ENCOUNTER — Ambulatory Visit (HOSPITAL_COMMUNITY)
Admission: RE | Admit: 2024-06-09 | Discharge: 2024-06-09 | Disposition: A | Discharge status: Home / Self Care | Source: Ambulatory Visit | Attending: Cardiovascular Disease | Admitting: Cardiovascular Disease

## 2024-06-09 DIAGNOSIS — I25118 Atherosclerotic heart disease of native coronary artery with other forms of angina pectoris: Secondary | ICD-10-CM | POA: Diagnosis not present

## 2024-06-09 DIAGNOSIS — R079 Chest pain, unspecified: Secondary | ICD-10-CM | POA: Diagnosis not present

## 2024-06-09 DIAGNOSIS — I209 Angina pectoris, unspecified: Secondary | ICD-10-CM | POA: Diagnosis not present

## 2024-06-09 MED ORDER — IOHEXOL 350 MG/ML SOLN
100.0000 mL | Freq: Once | INTRAVENOUS | Status: AC | PRN
  Administered 2024-06-09: 100 mL via INTRAVENOUS

## 2024-06-09 MED ORDER — NITROGLYCERIN 0.4 MG SL SUBL
0.8000 mg | SUBLINGUAL_TABLET | Freq: Once | SUBLINGUAL | Status: AC
  Administered 2024-06-09: 0.8 mg via SUBLINGUAL

## 2024-06-12 ENCOUNTER — Ambulatory Visit: Payer: Self-pay | Admitting: Cardiology

## 2024-06-12 DIAGNOSIS — E782 Mixed hyperlipidemia: Secondary | ICD-10-CM

## 2024-06-19 MED ORDER — ROSUVASTATIN CALCIUM 10 MG PO TABS: 10.0000 mg | ORAL_TABLET | Freq: Every day | ORAL | 3 refills | Status: AC

## 2024-06-21 NOTE — Progress Notes (Unsigned)
 Cardiology Office Note    Date:  06/23/2024  ID:  Russell [name], DOB 07-27-1961, MRN [id number] PCP:  Okey Carlin Redbird, MD  Cardiologist:  Oneil Parchment, MD  Electrophysiologist:  None   Chief Complaint: Follow up for syncope   History of Present Illness: .    Russell [name] is a 63 y.o. male with visit-pertinent history of diabetes, diverticulitis, history of angina with normal coronaries on LHC in 12/2016, hyperlipidemia.  Patient previously followed with Dr. Parchment, last visit in 2021.  Left heart catheterization in March 2018 showed normal coronary arteries, normal LV function, echo in January 2021 showed EF 55 to 60%, normal RV function, moderately dilated LA, no valvular abnormalities.  On 05/29/2024 patient presented to [facility] with concern for weight gain and dyspnea with exertion.  Patient reported that been present over the past week, noted that his lower extremities have been swelling as well.  Patient's proBNP was 505, x-ray showed some mild vascular congestion consistent with new onset CHF, patient was started on Lasix  and was set up for follow-up.  On 06/01/2024 patient presented to [facility] ED on 06/01/2024 after having an episode of left shoulder/arm pain, dizziness, syncope and nausea.  Patient reported that he is typically able to walk around 5 miles daily.  Patient was on a normal walk and found himself to be more fatigued, diaphoretic, dizzy compared to normal.  Patient reported he lost his necklace at some point during his walk and later he and his wife went out on the golf cart to try and retrieve a necklace, at that point he had a syncopal episode, denied any prodrome, stated that syncopal episode was reported by his wife.  Patient noted that there was a chance he could be dehydrated, taking Lasix  every day and being 21 pounds down.  Patient's lab work indicated an elevation in creatinine from 1-1.32 compared to 3 days prior, hypokalemic at 3.4, checks x-ray  showed no pleural effusions, no active disease.  It was felt that his syncopal episode was likely multifactorial from dehydration from recent Lasix  use, heavy temperature outside as well as vagal type episode in setting of dehydration.  Outpatient echo and coronary CTA were ordered.  Echocardiogram on 06/01/2024 indicated LVEF 60 to 65%, LV endocardial border not optimally defined to evaluate regional wall motion, diastolic parameters were consistent with grade 1 diastolic dysfunction, RV systolic function and size was normal, LA and RA were moderately dilated, trivial mitral valve regurgitation, there is borderline dilation of the aortic root measuring 39 mm, mild dilation of the ascending aorta measuring 40 mm.  Coronary CTA on 06/09/2024 indicated mild CAD in the mid LAD and mid RPDA, 25 to 49% stenosis, T PV 302 mm, 42nd percentile for age and sex matched control, TPD noted to be severe, coronary calcium  score of 162, placing patient in 58 percentile for age and sex matched control.  Patient was switched from pravastatin to Crestor  10 mg daily with recommendation to repeat lipids in 2 months, if LDL not at goal of less than 70 recommend referral to lipid clinic.  Today he presents for follow-up.  He reports that he is doing well overall.  He denies any chest pain or significant shortness of breath.  Patient does note that earlier in the week he noted a few pound weight gain and increased lower extremity edema, he reports that he took Lasix  20 mg daily once with complete resolution.  He denies any orthopnea or PND.  He denies any palpitations, any further presyncope or syncope.  Patient reports that he continues to walk 5 to 6 miles a day, tolerates very well. ROS: .   Today he denies chest pain, shortness of breath, fatigue, palpitations, melena, hematuria, hemoptysis, diaphoresis, weakness, presyncope, syncope, orthopnea, and PND.  All other systems are reviewed and otherwise negative. Studies Reviewed:  SABRA   EKG:  EKG is not ordered today.  CV Studies: Cardiac studies reviewed are outlined and summarized above. Otherwise please see EMR for full report. Cardiac Studies & Procedures   ______________________________________________________________________________________________ CARDIAC CATHETERIZATION  CARDIAC CATHETERIZATION 01/28/2017  Conclusion  Normal coronary arteries. Right dominant anatomy.  Normal left ventricular function.  Findings Coronary Findings Diagnostic  Dominance: Co-dominant  Left Circumflex  Third Obtuse Marginal Branch Vessel is small in size.  Intervention  No interventions have been documented.     ECHOCARDIOGRAM  ECHOCARDIOGRAM COMPLETE 06/01/2024  Narrative ECHOCARDIOGRAM REPORT    Patient Name:   Russell [name] Date of Exam: 06/01/2024 Medical Rec #:  [id number]         Height:       72.0 in Accession #:    [id number]        Weight:       159.2 lb Date of Birth:  1960-12-06          BSA:          1.934 m Patient Age:    63 years          BP:           138/85 mmHg Patient Gender: M                 HR:           57 bpm. Exam Location:  Outpatient  Procedure: 2D Echo, 3D Echo, Cardiac Doppler, Color Doppler and Strain Analysis (Both Spectral and Color Flow Doppler were utilized during procedure).  Indications:    R07.2 Precordial pain; R06.9 DOE; R55 Syncope; R60.0 Lower extremity edema  History:        Patient has prior history of Echocardiogram examinations, most recent 11/22/2019. Signs/Symptoms:Chest Pain, Dyspnea and Edema; Risk Factors:Non-Smoker. Patient had recent episode of syncope. He has had chest pains, DOE and leg edema. Patient recently on Lasix  for 2-3 days with 16 pound weight loss.  Sonographer:    Annabella Cater RVT, RDCS (AE), RDMS Referring Phys: [id number] MARK C SKAINS  IMPRESSIONS   1. Left ventricular ejection fraction, by estimation, is 60 to 65%. Left ventricular ejection fraction by 3D volume is 62 %. The left  ventricle has normal function. Left ventricular endocardial border not optimally defined to evaluate regional wall motion. Left ventricular diastolic parameters are consistent with Grade I diastolic dysfunction (impaired relaxation). The average left ventricular global longitudinal strain is -18.2 %. The global longitudinal strain is normal. 2. Right ventricular systolic function is normal. The right ventricular size is normal. 3. Left atrial size was moderately dilated. 4. Right atrial size was moderately dilated. 5. The mitral valve is grossly normal. Trivial mitral valve regurgitation. 6. The aortic valve is tricuspid. Aortic valve regurgitation is trivial. 7. Aortic dilatation noted. There is borderline dilatation of the aortic root, measuring 39 mm. There is mild dilatation of the ascending aorta, measuring 40 mm. 8. The inferior vena cava is normal in size with greater than 50% respiratory variability, suggesting right atrial pressure of 3 mmHg.  Comparison(s): No significant change from prior study. 11/22/2019: LVEF 55-60%, GLS -18.1%.  FINDINGS Left Ventricle: Left ventricular ejection fraction, by estimation, is 60 to 65%. Left ventricular ejection fraction by 3D volume is 62 %. The left ventricle has normal function. Left ventricular endocardial border not optimally defined to evaluate regional wall motion. The average left ventricular global longitudinal strain is -18.2 %. Strain was performed and the global longitudinal strain is normal. The left ventricular internal cavity size was normal in size. There is no left ventricular hypertrophy. Left ventricular diastolic parameters are consistent with Grade I diastolic dysfunction (impaired relaxation). Indeterminate filling pressures.  Right Ventricle: The right ventricular size is normal. No increase in right ventricular wall thickness. Right ventricular systolic function is normal.  Left Atrium: Left atrial size was moderately  dilated.  Right Atrium: Right atrial size was moderately dilated.  Pericardium: There is no evidence of pericardial effusion.  Mitral Valve: The mitral valve is grossly normal. Trivial mitral valve regurgitation.  Tricuspid Valve: The tricuspid valve is grossly normal. Tricuspid valve regurgitation is trivial.  Aortic Valve: The aortic valve is tricuspid. Aortic valve regurgitation is trivial. Aortic valve mean gradient measures 6.0 mmHg. Aortic valve peak gradient measures 11.2 mmHg. Aortic valve area, by VTI measures 2.87 cm.  Pulmonic Valve: The pulmonic valve was grossly normal. Pulmonic valve regurgitation is trivial.  Aorta: Aortic dilatation noted. There is borderline dilatation of the aortic root, measuring 39 mm. There is mild dilatation of the ascending aorta, measuring 40 mm.  Venous: The inferior vena cava is normal in size with greater than 50% respiratory variability, suggesting right atrial pressure of 3 mmHg.  IAS/Shunts: No atrial level shunt detected by color flow Doppler.  Additional Comments: 3D was performed not requiring image post processing on an independent workstation and was normal.   LEFT VENTRICLE PLAX 2D LVIDd:         5.34 cm         Diastology LVIDs:         3.38 cm         LV e' medial:    6.87 cm/s LV PW:         1.00 cm         LV E/e' medial:  10.4 LV IVS:        0.97 cm         LV e' lateral:   9.76 cm/s LVOT diam:     2.40 cm         LV E/e' lateral: 7.3 LV SV:         101 LV SV Index:   52              2D Longitudinal LVOT Area:     4.52 cm        Strain 2D Strain GLS   -18.2 % Avg:  3D Volume EF LV 3D EF:    Left ventricul ar ejection fraction by 3D volume is 62 %.  3D Volume EF: 3D EF:        62 % LV EDV:       142 ml LV ESV:       54 ml LV SV:        88 ml  RIGHT VENTRICLE RV S prime:     16.00 cm/s TAPSE (M-mode): 2.5 cm  LEFT ATRIUM           Index        RIGHT ATRIUM           Index LA diam:  4.40 cm 2.28  cm/m   RA Area:     22.00 cm LA Vol (A2C): 83.1 ml 42.98 ml/m  RA Volume:   82.50 ml  42.66 ml/m LA Vol (A4C): 72.7 ml 37.60 ml/m AORTIC VALVE                     PULMONIC VALVE AV Area (Vmax):    2.81 cm      PV Vmax:          1.12 m/s AV Area (Vmean):   2.82 cm      PV Peak grad:     5.0 mmHg AV Area (VTI):     2.87 cm      PR End Diast Vel: 2.83 msec AV Vmax:           167.50 cm/s AV Vmean:          115.000 cm/s AV VTI:            0.353 m AV Peak Grad:      11.2 mmHg AV Mean Grad:      6.0 mmHg LVOT Vmax:         104.00 cm/s LVOT Vmean:        71.800 cm/s LVOT VTI:          0.224 m LVOT/AV VTI ratio: 0.64 AR Vena Contracta: 0.18 cm  AORTA Ao Root diam: 3.90 cm Ao Asc diam:  4.00 cm Ao Arch diam: 3.5 cm  MITRAL VALVE MV Area (PHT): 3.72 cm    SHUNTS MV Decel Time: 204 msec    Systemic VTI:  0.22 m MV E velocity: 71.70 cm/s  Systemic Diam: 2.40 cm MV A velocity: 73.50 cm/s MV E/A ratio:  0.98  Vinie Maxcy MD Electronically signed by Vinie Maxcy MD Signature Date/Time: 06/01/2024/3:21:47 PM    Final      CT SCANS  CT CORONARY MORPH W/CTA COR W/SCORE 06/09/2024  Addendum 06/18/2024  1:40 PM ADDENDUM REPORT: 06/18/2024 13:38  EXAM: OVER-READ INTERPRETATION  CT CHEST  The following report is an over-read performed by radiologist Dr. Fonda Mom Megumi [facility] on 06/18/2024. This over-read does not include interpretation of cardiac or coronary anatomy or pathology. The coronary CTA interpretation by the cardiologist is attached.  COMPARISON:  None.  FINDINGS: Cardiovascular:  See findings discussed in the body of the report.  Mediastinum/Nodes: No suspicious adenopathy identified. Imaged mediastinal structures are unremarkable.  Lungs/Pleura: Imaged lungs are clear. No pleural effusion or pneumothorax.  Upper Abdomen: No acute abnormality.  Musculoskeletal: No chest wall abnormality. No acute  osseous findings.  IMPRESSION: No acute extracardiac incidental findings.   Electronically Signed By: Fonda Field M.D. On: 06/18/2024 13:38  Narrative CLINICAL DATA:  Chest pain/anginal equiv  EXAM: Cardiac/Coronary CTA  TECHNIQUE: A non-contrast, gated CT scan was obtained with axial slices of 2.5 mm through the heart for calcium  scoring. Calcium  scoring was performed using the Agatston method. A 120 kV prospective, gated, contrast cardiac CT scan was obtained. Gantry rotation speed was 230 msec and collimation was 0.63 mm. Two sublingual nitroglycerin  tablets (0.8 mg) were given. The 3D data set was reconstructed with motion correction for the best systolic or diastolic phase. Images were analyzed on a dedicated workstation using MPR, MIP, and VRT modes. The patient received 95 cc of contrast.  FINDINGS: Image quality: Good  Noise artifact is: Limited  Coronary calcium  score is 162, which places the patient in the 68th percentile for age and sex  matched control.  Coronary arteries: Normal coronary origins.  Right dominance.  Right Coronary Artery: Minimal plaque proximal and mid RCA, <25% stenosis. Mild plaque in the mid RPDA, 25-49% stenosis. Moderate plaque in the proximal conus branch, small caliber vessel.  Left Main Coronary Artery: No detectable plaque or stenosis.  Left Anterior Descending Coronary Artery: Minimal plaque proximal LAD, <25% stenosis. Mild plaque mid LAD, 25-49% stenosis.  Ramus intermedius: No detectable plaque or stenosis.  Left Circumflex Artery: Minimal plaque OM2, <25% stenosis.  Aorta: Normal size, 36 mm at the mid ascending aorta (level of the PA bifurcation) measured double oblique.  Aortic Valve: Mild calcifications. AV calcium  score 149  Other findings:  Normal pulmonary vein drainage into the left atrium.  Normal left atrial appendage without thrombus.  Normal size of the pulmonary artery.  Please see separate  report from [facility] for non-cardiac findings.  IMPRESSION: 1. Mild CAD in the mid LAD and mid RPDA, 25-49% stenosis, CADRADS 2.  2. Total plaque volume 302 mm3 (calcified plaque 51 mm3; non-calcified plaque 251 mm3), which is 42nd percentile for age- and sex-matched controls. TPV is severe.  3. Coronary calcium  score is 162, which places the patient in the 68th percentile for age and sex matched control.  4. Normal coronary origins with right dominance.  5. Aortic Valve: Mild calcifications. AV calcium  score 149  RECOMMENDATIONS: CAD-RADS 2. Mild non-obstructive CAD (25-49%). Consider non-atherosclerotic causes of chest pain. Consider preventive therapy and risk factor modification.  Electronically Signed: By: Soyla Merck M.D. On: 06/09/2024 16:41     ______________________________________________________________________________________________       Current Reported Medications:.    Current Meds  Medication Sig   FARXIGA 10 MG TABS tablet Take 5 mg by mouth daily.   furosemide  (LASIX ) 20 MG tablet Take 1 table once a day as needed for lower extremity edema, shortness of breath, or a weight gain of 2-3 lbs over night or 5 lbs in a week   rosuvastatin  (CRESTOR ) 10 MG tablet Take 1 tablet (10 mg total) by mouth daily.    Physical Exam:   VS:  BP 128/76   Pulse 64   Ht 6' (1.829 m)   Wt 239 lb (108.4 kg)   SpO2 96%   BMI 32.41 kg/m    Wt Readings from Last 3 Encounters:  06/22/24 239 lb (108.4 kg)  05/29/24 159 lb 3.2 oz (72.2 kg)  11/14/19 292 lb (132.5 kg)    GEN: Well nourished, well developed in no acute distress NECK: No JVD; No carotid bruits CARDIAC: RRR, no murmurs, rubs, gallops RESPIRATORY:  Clear to auscultation without rales, wheezing or rhonchi  ABDOMEN: Soft, non-tender, non-distended EXTREMITIES:  No edema; No acute deformity     Asessement and Plan:.    CAD: Coronary CTA on 06/09/2024 indicated mild CAD in the mid LAD and mid  RPDA, 25 to 49% stenosis, T PV 302 mm, 42nd percentile for age and sex matched control, TPD noted to be severe, coronary calcium  score of 162, placing patient in 90 percentile for age and sex matched control.  Patient was switched from pravastatin to Crestor  10 mg daily with recommendation to repeat lipids in 2 months, if LDL not at goal of less than 70 recommend referral to lipid clinic.  Today he reports that he is doing very well.  He denies any further chest pain, denies shortness of breath.  Reviewed patient's coronary CTA results with him and his wife, all questions answered.  Continue aspirin   81 mg daily, Farxiga 5 mg daily, Crestor  10 mg daily.  Lower extremity edema/diastolic dysfunction: Echo on 2/68/7974 indicated LVEF 60 to 65%, grade 1 diastolic dysfunction.  Patient notes intermittent increased lower extremity edema.  He reports he had increased lower extremity edema and mild weight gain earlier in the week, took Lasix  20 mg once with complete resolution.  Patient denies any increased shortness of breath, orthopnea or PND.  Patient will take Lasix  20 mg once daily as needed for increased lower extremity edema, weight gain of 3 pounds overnight or 5 pounds in a week.  Continue Farxiga 5 mg daily, patient plans to discuss increasing to 10 mg daily with his PCP, currently on 5 mg daily for history of diabetes.  Syncope: Patient with syncopal episode in 05/2024 felt to be related to increased use of Lasix , dehydration and hot temperatures with a possible vasovagal component.  Today he reports that he is doing well, denies any further presyncope or syncope.  Patient will continue monitoring it for the office if present.  Encouraged patient to remain well-hydrated.  HLD: Patient recently started on Crestor  10 mg daily.  Check fasting lipid profile and LFTs in 2 months.  HTN: Blood pressure today 120/76.  Continue current antihypertensive regimen.  History of elevated LFTs: Notes increase in LFTs  while on lipitor. Recently started on Rosuvastatin , check CMET today.    Disposition: F/u with Chevon Laufer, NP or Dr. Jeffrie in 6 months or sooner if needed.   Signed, Saber Dickerman D Melanye Hiraldo, NP

## 2024-06-22 ENCOUNTER — Encounter: Payer: Self-pay | Admitting: Cardiology

## 2024-06-22 ENCOUNTER — Ambulatory Visit: Attending: Cardiology | Admitting: Cardiology

## 2024-06-22 VITALS — BP 128/76 | HR 64 | Ht 72.0 in | Wt 239.0 lb

## 2024-06-22 DIAGNOSIS — I2510 Atherosclerotic heart disease of native coronary artery without angina pectoris: Secondary | ICD-10-CM

## 2024-06-22 DIAGNOSIS — I10 Essential (primary) hypertension: Secondary | ICD-10-CM

## 2024-06-22 DIAGNOSIS — R600 Localized edema: Secondary | ICD-10-CM | POA: Diagnosis not present

## 2024-06-22 DIAGNOSIS — E782 Mixed hyperlipidemia: Secondary | ICD-10-CM | POA: Diagnosis not present

## 2024-06-22 LAB — COMPREHENSIVE METABOLIC PANEL WITH GFR
ALT: 34 IU/L (ref 0–44)
AST: 32 IU/L (ref 0–40)
Albumin: 4.4 g/dL (ref 3.9–4.9)
Alkaline Phosphatase: 81 IU/L (ref 44–121)
BUN/Creatinine Ratio: 24 (ref 10–24)
BUN: 26 mg/dL (ref 8–27)
Bilirubin Total: 0.7 mg/dL (ref 0.0–1.2)
CO2: 21 mmol/L (ref 20–29)
Calcium: 9.9 mg/dL (ref 8.6–10.2)
Chloride: 105 mmol/L (ref 96–106)
Creatinine, Ser: 1.1 mg/dL (ref 0.76–1.27)
Globulin, Total: 2.4 g/dL (ref 1.5–4.5)
Glucose: 93 mg/dL (ref 70–99)
Potassium: 4.1 mmol/L (ref 3.5–5.2)
Sodium: 142 mmol/L (ref 134–144)
Total Protein: 6.8 g/dL (ref 6.0–8.5)
eGFR: 75 mL/min/1.73 (ref >59)

## 2024-06-22 MED ORDER — FUROSEMIDE 20 MG PO TABS
ORAL_TABLET | ORAL | 3 refills | Status: AC
Start: 2024-06-22 — End: ?

## 2024-06-22 NOTE — Patient Instructions (Addendum)
 Medication Instructions:  Take Lasix  20 mg as needed for lower extremity edema, shortness of breath, or a weight gain of 2-3 lbs over night or 5 lbs in a week *If you need a refill on your cardiac medications before your next appointment, please call your pharmacy*  Lab Work: Today we are going to draw a Cmet In 6 weeks we are going to need a fasting lipid panel and LFT's If you have labs (blood work) drawn today and your tests are completely normal, you will receive your results only by: MyChart Message (if you have MyChart) OR A paper copy in the mail If you have any lab test that is abnormal or we need to change your treatment, we will call you to review the results.  Testing/Procedures: No testing  Follow-Up: At [facility], you and your health needs are our priority.  As part of our continuing mission to provide you with exceptional heart care, our providers are all part of one team.  This team includes your primary Cardiologist (physician) and Advanced Practice Providers or APPs (Physician Assistants and Nurse Practitioners) who all work together to provide you with the care you need, when you need it.  Your next appointment:   6 month(s)  Provider:   Oneil Parchment, MD or Katlyn West, NP   We recommend signing up for the patient portal called MyChart.  Sign up information is provided on this After Visit Summary.  MyChart is used to connect with patients for Virtual Visits (Telemedicine).  Patients are able to view lab/test results, encounter notes, upcoming appointments, etc.  Non-urgent messages can be sent to your provider as well.   To learn more about what you can do with MyChart, go to [email or web address].   Other Instructions

## 2024-06-23 ENCOUNTER — Encounter: Payer: Self-pay | Admitting: Cardiology

## 2024-06-23 ENCOUNTER — Ambulatory Visit: Payer: Self-pay | Admitting: Cardiology

## 2024-08-17 DIAGNOSIS — E1169 Type 2 diabetes mellitus with other specified complication: Secondary | ICD-10-CM | POA: Diagnosis not present

## 2024-08-17 DIAGNOSIS — E782 Mixed hyperlipidemia: Secondary | ICD-10-CM | POA: Diagnosis not present

## 2024-08-17 DIAGNOSIS — I2510 Atherosclerotic heart disease of native coronary artery without angina pectoris: Secondary | ICD-10-CM | POA: Diagnosis not present

## 2024-08-17 DIAGNOSIS — Z6833 Body mass index (BMI) 33.0-33.9, adult: Secondary | ICD-10-CM | POA: Diagnosis not present

## 2024-08-18 LAB — HEPATIC FUNCTION PANEL
ALT: 38 IU/L (ref 0–44)
AST: 23 IU/L (ref 0–40)
Albumin: 4.5 g/dL (ref 3.9–4.9)
Alkaline Phosphatase: 85 IU/L (ref 47–123)
Bilirubin Total: 0.8 mg/dL (ref 0.0–1.2)
Bilirubin, Direct: 0.27 mg/dL (ref 0.00–0.40)
Total Protein: 6.9 g/dL (ref 6.0–8.5)

## 2024-08-18 LAB — LIPID PANEL
Chol/HDL Ratio: 2.3 ratio (ref 0.0–5.0)
Cholesterol, Total: 115 mg/dL (ref 100–199)
HDL: 49 mg/dL (ref >39)
LDL Chol Calc (NIH): 51 mg/dL (ref 0–99)
Triglycerides: 76 mg/dL (ref 0–149)
VLDL Cholesterol Cal: 15 mg/dL (ref 5–40)

## 2024-08-23 DIAGNOSIS — M76821 Posterior tibial tendinitis, right leg: Secondary | ICD-10-CM | POA: Diagnosis not present
